# Patient Record
Sex: Male | Born: 1985 | Race: White | Hispanic: No | Smoking: Current every day smoker
Health system: Southern US, Community
[De-identification: ages and names within clinical notes are randomized; demographics above are authoritative.]

## PROBLEM LIST (undated history)

## (undated) DIAGNOSIS — F319 Bipolar disorder, unspecified: Secondary | ICD-10-CM

## (undated) DIAGNOSIS — F329 Major depressive disorder, single episode, unspecified: Secondary | ICD-10-CM

## (undated) DIAGNOSIS — F1910 Other psychoactive substance abuse, uncomplicated: Secondary | ICD-10-CM

## (undated) DIAGNOSIS — M549 Dorsalgia, unspecified: Secondary | ICD-10-CM

## (undated) DIAGNOSIS — F32A Depression, unspecified: Secondary | ICD-10-CM

## (undated) DIAGNOSIS — G8929 Other chronic pain: Secondary | ICD-10-CM

---

## 2009-11-20 ENCOUNTER — Emergency Department (HOSPITAL_COMMUNITY): Admission: EM | Admit: 2009-11-20 | Discharge: 2009-11-21 | Payer: Self-pay | Admitting: Emergency Medicine

## 2009-11-21 ENCOUNTER — Ambulatory Visit: Payer: Self-pay | Admitting: Psychiatry

## 2009-11-21 ENCOUNTER — Inpatient Hospital Stay (HOSPITAL_COMMUNITY): Admission: RE | Admit: 2009-11-21 | Discharge: 2009-11-25 | Payer: Self-pay | Admitting: Psychiatry

## 2010-07-11 LAB — BASIC METABOLIC PANEL
CO2: 26 mEq/L (ref 19–32)
Calcium: 9 mg/dL (ref 8.4–10.5)
GFR calc Af Amer: >60 mL/min (ref >60)
GFR calc non Af Amer: >60 mL/min (ref >60)
Sodium: 138 mEq/L (ref 135–145)

## 2010-07-11 LAB — DIFFERENTIAL
Lymphs Abs: 1.9 10*3/uL (ref 0.7–4.0)
Monocytes Relative: 4 % (ref 3–12)
Neutro Abs: 6.6 10*3/uL (ref 1.7–7.7)
Neutrophils Relative %: 71 % (ref 43–77)

## 2010-07-11 LAB — LIPID PANEL
HDL: 33 mg/dL — ABNORMAL LOW (ref >39)
LDL Cholesterol: 107 mg/dL — ABNORMAL HIGH (ref 0–99)
Total CHOL/HDL Ratio: 4.8 RATIO
VLDL: 20 mg/dL (ref 0–40)

## 2010-07-11 LAB — HEPATIC FUNCTION PANEL
AST: 15 U/L (ref 0–37)
Albumin: 3.7 g/dL (ref 3.5–5.2)
Alkaline Phosphatase: 69 U/L (ref 39–117)
Total Bilirubin: 0.5 mg/dL (ref 0.3–1.2)

## 2010-07-11 LAB — URINALYSIS, ROUTINE W REFLEX MICROSCOPIC
Bilirubin Urine: NEGATIVE
Nitrite: NEGATIVE
Specific Gravity, Urine: 1.025 (ref 1.005–1.030)
pH: 7 (ref 5.0–8.0)

## 2010-07-11 LAB — CBC
Hemoglobin: 15.3 g/dL (ref 13.0–17.0)
RBC: 5 MIL/uL (ref 4.22–5.81)
WBC: 9.4 10*3/uL (ref 4.0–10.5)

## 2010-07-11 LAB — TRICYCLICS SCREEN, URINE: TCA Scrn: NOT DETECTED

## 2010-07-11 LAB — RAPID URINE DRUG SCREEN, HOSP PERFORMED: Cocaine: NOT DETECTED

## 2010-10-20 ENCOUNTER — Emergency Department (HOSPITAL_COMMUNITY)
Admission: EM | Admit: 2010-10-20 | Discharge: 2010-10-21 | Disposition: A | Discharge status: Other Acute Inpatient Hospital | Payer: Self-pay | Attending: Emergency Medicine | Admitting: Emergency Medicine

## 2010-10-20 DIAGNOSIS — R45850 Homicidal ideations: Secondary | ICD-10-CM | POA: Insufficient documentation

## 2010-10-20 DIAGNOSIS — F1110 Opioid abuse, uncomplicated: Secondary | ICD-10-CM | POA: Insufficient documentation

## 2010-10-20 DIAGNOSIS — F329 Major depressive disorder, single episode, unspecified: Secondary | ICD-10-CM | POA: Insufficient documentation

## 2010-10-20 DIAGNOSIS — F3289 Other specified depressive episodes: Secondary | ICD-10-CM | POA: Insufficient documentation

## 2010-10-20 DIAGNOSIS — R45851 Suicidal ideations: Secondary | ICD-10-CM | POA: Insufficient documentation

## 2010-10-20 DIAGNOSIS — F1210 Cannabis abuse, uncomplicated: Secondary | ICD-10-CM | POA: Insufficient documentation

## 2010-10-20 DIAGNOSIS — F1410 Cocaine abuse, uncomplicated: Secondary | ICD-10-CM | POA: Insufficient documentation

## 2010-10-20 LAB — CBC
HCT: 44 % (ref 39.0–52.0)
Hemoglobin: 15.7 g/dL (ref 13.0–17.0)
MCH: 30.4 pg (ref 26.0–34.0)
MCV: 85.3 fL (ref 78.0–100.0)
RBC: 5.16 MIL/uL (ref 4.22–5.81)
RDW: 12.2 % (ref 11.5–15.5)
WBC: 9.9 10*3/uL (ref 4.0–10.5)

## 2010-10-20 LAB — URINALYSIS, ROUTINE W REFLEX MICROSCOPIC
Hgb urine dipstick: NEGATIVE
Ketones, ur: 15 mg/dL — AB
Protein, ur: 30 mg/dL — AB
Urobilinogen, UA: 1 mg/dL (ref 0.0–1.0)

## 2010-10-20 LAB — COMPREHENSIVE METABOLIC PANEL
Alkaline Phosphatase: 85 U/L (ref 39–117)
BUN: 12 mg/dL (ref 6–23)
CO2: 27 mEq/L (ref 19–32)
Chloride: 105 mEq/L (ref 96–112)
GFR calc Af Amer: >60 mL/min (ref >60)
Glucose, Bld: 89 mg/dL (ref 70–99)
Potassium: 3.2 mEq/L — ABNORMAL LOW (ref 3.5–5.1)
Total Bilirubin: 0.5 mg/dL (ref 0.3–1.2)

## 2010-10-20 LAB — URINE MICROSCOPIC-ADD ON

## 2010-10-20 LAB — RAPID URINE DRUG SCREEN, HOSP PERFORMED
Cocaine: POSITIVE — AB
Opiates: POSITIVE — AB

## 2010-10-20 LAB — DIFFERENTIAL
Eosinophils Absolute: 0.2 10*3/uL (ref 0.0–0.7)
Lymphocytes Relative: 26 % (ref 12–46)
Lymphs Abs: 2.5 10*3/uL (ref 0.7–4.0)
Monocytes Relative: 10 % (ref 3–12)
Neutrophils Relative %: 62 % (ref 43–77)

## 2010-10-20 LAB — SALICYLATE LEVEL: Salicylate Lvl: <2 mg/dL — ABNORMAL LOW (ref 2.8–20.0)

## 2010-10-22 ENCOUNTER — Inpatient Hospital Stay (HOSPITAL_COMMUNITY)
Admission: AD | Admit: 2010-10-22 | Discharge: 2010-10-26 | DRG: 897 | Disposition: A | Discharge status: Home / Self Care | Payer: PRIVATE HEALTH INSURANCE | Source: Ambulatory Visit | Attending: Psychiatry | Admitting: Psychiatry

## 2010-10-22 DIAGNOSIS — F1120 Opioid dependence, uncomplicated: Principal | ICD-10-CM

## 2010-10-22 DIAGNOSIS — F1910 Other psychoactive substance abuse, uncomplicated: Secondary | ICD-10-CM

## 2010-10-22 DIAGNOSIS — Z5900 Homelessness unspecified: Secondary | ICD-10-CM

## 2010-10-22 DIAGNOSIS — Z560 Unemployment, unspecified: Secondary | ICD-10-CM

## 2010-10-22 DIAGNOSIS — F3289 Other specified depressive episodes: Secondary | ICD-10-CM

## 2010-10-22 DIAGNOSIS — R45851 Suicidal ideations: Secondary | ICD-10-CM

## 2010-10-22 DIAGNOSIS — F39 Unspecified mood [affective] disorder: Secondary | ICD-10-CM

## 2010-10-22 DIAGNOSIS — F1994 Other psychoactive substance use, unspecified with psychoactive substance-induced mood disorder: Secondary | ICD-10-CM

## 2010-10-22 DIAGNOSIS — F329 Major depressive disorder, single episode, unspecified: Secondary | ICD-10-CM

## 2010-10-22 DIAGNOSIS — Z880 Allergy status to penicillin: Secondary | ICD-10-CM

## 2010-10-23 NOTE — H&P (Signed)
NAMERENATO, SPELLMAN NO.:  [id number]  MEDICAL RECORD NO.:  [id number]  LOCATION:  0304                          FACILITY:  [facility]  PHYSICIAN:  Debbora Lacrosse, MD       DATE OF BIRTH:  12-Jan-1986  DATE OF ADMISSION:  10/21/2010 DATE OF DISCHARGE:                      PSYCHIATRIC ADMISSION ASSESSMENT   This is a 25 year old male who was voluntarily admitted on October 21, 2010.  HISTORY OF PRESENT ILLNESS:  The patient presents with a history of depression and suicidal thinking and substance use.  He has been using marijuana, cocaine, pain pills, primarily Percocet.  His last use was 2 days ago.  He had a suicidal plan to overdose.  He denies any psychotic symptoms.  His sleep has been satisfactory.  He has lost some weight.  PAST PSYCHIATRIC HISTORY:  The patient states he was here about a year ago for "same thing, depression and substance use."  Has been on Celexa and Paxil in the past which he thought was effective.  SOCIAL HISTORY:  A 25 year old married but states he has been separated for 2 years, has a child.  The patient is unemployed.  He lives alone. Considers himself homeless at this time.  He does have some family in [place].  FAMILY HISTORY:  None.  ALCOHOL/DRUG HISTORY:  As above.  Denies any IV drug use.  PRIMARY CARE PROVIDER:  None.  MEDICAL PROBLEMS:  The patient considers himself healthy.  MEDICATIONS:  None.  DRUG ALLERGIES:  PENICILLIN.  PHYSICAL EXAM:  Done in the emergency room.  This is a normally- developed male.  He appears in no distress.  He is again reporting some abdominal cramping.  Physical exam the emergency room was reviewed with no significant findings.  LABORATORY DATA:  Shows a salicylate less than 2.  CBC within normal limits.  Urine drug screen positive for opiates, positive for cocaine, positive for cannabis.  Urinalysis is negative.  Acetaminophen level less than 15.  MENTAL STATUS EXAM:  Patient is alert and  he is dressed in his own clothing, casually dressed.  No eye contact.  He is looking at the door during the interview.  Speech is clear, normal pace and tone.  The patient's mood is depressed.  Possibly paranoid, reserved, offering little.  Promises safety.  He denies any suicidal or homicidal thoughts.  Cognitive function intact. Memory is intact.  Judgment and insight are fair. Poor historian AXIS I:  Opiate dependence, polysubstance abuse, rule out substance- induced mood disorder. AXIS II:  Deferred. AXIS III:  No known medical conditions. AXIS IV:  Other psychosocial problems, possible problems with housing and poor social support. AXIS V:  Current is 45.  Our plan is to start the patient on the clonidine protocol for withdrawal symptoms.  We will continue to monitor comorbidities.  The patient is interested in going to the [facility], case manager will address that possibility.  His tentative length of stay at this time is 3 to 5 days.     Landry Corporal, N.P.   ______________________________ Debbora Lacrosse, MD    JO/MEDQ  D:  10/22/2010  T:  10/22/2010  Job:  [id number]  Electronically Signed by Landry Corporal  N.P. on 10/22/2010 03:44:05 PM Electronically Signed by Andi Devon Pradeep Beaubrun  on 10/23/2010 08:18:37 AM

## 2010-10-25 DIAGNOSIS — F1910 Other psychoactive substance abuse, uncomplicated: Secondary | ICD-10-CM

## 2010-10-25 DIAGNOSIS — F39 Unspecified mood [affective] disorder: Secondary | ICD-10-CM

## 2010-10-30 NOTE — Discharge Summary (Signed)
Reginald Bowman, Reginald Bowman               ACCOUNT NO.:  [id number]  MEDICAL RECORD NO.:  [id number]  LOCATION:  0302                          FACILITY:  [facility]  PHYSICIAN:  Eulogio Ditch, MD DATE OF BIRTH:  1986-02-03  DATE OF ADMISSION:  10/22/2010 DATE OF DISCHARGE:  10/26/2010                              DISCHARGE SUMMARY   IDENTIFYING INFORMATION:  This is a 25 year old Caucasian male.  This is a voluntary admission.  HISTORY OF PRESENT ILLNESS:  This a second [facility] admission for Reginald Bowman who presents with a history of depression and suicidal thinking concurrent with substance abuse.  He had been using marijuana, cocaine and pain pills, primarily Percocet, with last use 2 days prior to admission.  He had a suicidal plan to overdose on any drug he could get off the street. He had a history of a previous admission in August 2011 also for depression and polysubstance abuse and a history of previous trials on Celexa and Paxil which he thought were effective.  He is a 25 year old separated young man who was homeless.  MEDICAL EVALUATION AND DIAGNOSTIC STUDIES:  He was medically evaluated in the emergency room where his full physical exam is documented.  He is a normally developed Caucasian male, medium height and weight and denying a history of IV drug abuse.  He did present with intestinal cramping and some skeletal muscle cramping which he attributed to opiate withdrawal.  His urine drug screen was noted to be positive for opiates, cocaine and cannabis metabolites.  Acetaminophen level was less than 15. CBC normal chemistries unremarkable and unremarkable urinalysis.  COURSE OF HOSPITALIZATION:  He was admitted to our dual diagnosis unit and given a provisional diagnosis of substance induced mood disorder, opiate dependence and polysubstance abuse.  He was started on a clonidine protocol to manage his opiate withdrawal symptoms and gradually assimilated into the milieu.  By July 1 he  was denying any further suicidal thoughts.  His depression was decreased to a score of 3 with 10 being the most hopeless.  His attention in group therapy was spotty.  He was generally cooperative and appropriate with peers and staff.  He was started on sertraline 50 mg p.o. q.a.m. and was started on Geodon 40 mg q.h.s. to address his complaints of agitation.  He was initially evaluated by Dr. Artelia Laroche at the time of admission and was transferred to the care of Eulogio Ditch, MD on July 2. His mood stabilized.  He complained of nausea and did not want to take the Zoloft and Geodon which were subsequently discontinued.  His mood continued to be stable and by July 2 he was requesting to be discharged, felt better, withdrawal symptoms had subsided.  He was in full contact with reality and denying any dangerous thoughts.  He declined our case manager's efforts to place him in a drug rehab program and requested a bus ticket with plans to hitchhike to the [place] area where he had some relatives.  DISCHARGE/PLAN:  Follow up with [facility] in [place], [place] on October 29, 2010 at 10:30 a.m.  DISCHARGE MEDICATIONS:  None.  DISCHARGE DIAGNOSIS:  Substance-induced mood disorder, 0piate abuse and dependence, polysubstance  abuse.     Young Berry. Lorin Picket, N.P.   ______________________________ Eulogio Ditch, MD    MAS/MEDQ  D:  10/29/2010  T:  10/29/2010  Job:  [id number]  Electronically Signed by Kari Baars N.P. on 10/29/2010 04:35:43 PM Electronically Signed by Eulogio Ditch  on 10/30/2010 07:32:22 AM

## 2013-06-12 DIAGNOSIS — F1020 Alcohol dependence, uncomplicated: Secondary | ICD-10-CM | POA: Insufficient documentation

## 2014-07-24 ENCOUNTER — Emergency Department
Admit: 2014-07-24 | Disposition: A | Discharge status: Other Acute Inpatient Hospital | Payer: Self-pay | Admitting: Emergency Medicine

## 2014-07-24 LAB — URINALYSIS, COMPLETE
BACTERIA: NONE SEEN
Bilirubin,UR: NEGATIVE
Blood: NEGATIVE
GLUCOSE, UR: NEGATIVE mg/dL (ref 0–75)
LEUKOCYTE ESTERASE: NEGATIVE
NITRITE: NEGATIVE
Ph: 5 (ref 4.5–8.0)
Protein: 30
RBC,UR: 2 /HPF (ref 0–5)
SPECIFIC GRAVITY: 1.032 (ref 1.003–1.030)
SQUAMOUS EPITHELIAL: NONE SEEN

## 2014-07-24 LAB — ACETAMINOPHEN LEVEL: Acetaminophen: <10 ug/mL

## 2014-07-24 LAB — DRUG SCREEN, URINE
Amphetamines, Ur Screen: NEGATIVE
Barbiturates, Ur Screen: NEGATIVE
Benzodiazepine, Ur Scrn: NEGATIVE
Cannabinoid 50 Ng, Ur ~~LOC~~: POSITIVE
Cocaine Metabolite,Ur ~~LOC~~: NEGATIVE
MDMA (Ecstasy)Ur Screen: NEGATIVE
METHADONE, UR SCREEN: NEGATIVE
Opiate, Ur Screen: NEGATIVE
Phencyclidine (PCP) Ur S: NEGATIVE
TRICYCLIC, UR SCREEN: NEGATIVE

## 2014-07-24 LAB — COMPREHENSIVE METABOLIC PANEL
ALBUMIN: 4.6 g/dL
ALT: 14 U/L — AB
AST: 19 U/L
Alkaline Phosphatase: 65 U/L
Anion Gap: 7 (ref 7–16)
BUN: 19 mg/dL
Bilirubin,Total: 0.3 mg/dL
Calcium, Total: 9.4 mg/dL
Chloride: 106 mmol/L
Co2: 25 mmol/L
Creatinine: 0.96 mg/dL
EGFR (Non-African Amer.): >60
Glucose: 115 mg/dL — ABNORMAL HIGH
POTASSIUM: 3.5 mmol/L
Sodium: 138 mmol/L
TOTAL PROTEIN: 7.7 g/dL

## 2014-07-24 LAB — CBC
HCT: 47.8 % (ref 40.0–52.0)
HGB: 15.8 g/dL (ref 13.0–18.0)
MCH: 29.3 pg (ref 26.0–34.0)
MCHC: 33 g/dL (ref 32.0–36.0)
MCV: 89 fL (ref 80–100)
Platelet: 171 10*3/uL (ref 150–440)
RBC: 5.38 10*6/uL (ref 4.40–5.90)
RDW: 13.7 % (ref 11.5–14.5)
WBC: 15.4 10*3/uL — ABNORMAL HIGH (ref 3.8–10.6)

## 2014-07-24 LAB — SALICYLATE LEVEL: Salicylates, Serum: <4 mg/dL

## 2014-07-24 LAB — ETHANOL

## 2014-08-06 ENCOUNTER — Emergency Department
Admit: 2014-08-06 | Disposition: A | Discharge status: Home / Self Care | Payer: Self-pay | Admitting: Emergency Medicine

## 2014-08-06 LAB — COMPREHENSIVE METABOLIC PANEL
ANION GAP: 7 (ref 7–16)
Albumin: 4.9 g/dL
Alkaline Phosphatase: 62 U/L
BILIRUBIN TOTAL: 0.7 mg/dL
BUN: 19 mg/dL
CO2: 28 mmol/L
Calcium, Total: 9.4 mg/dL
Chloride: 106 mmol/L
Creatinine: 1.18 mg/dL
EGFR (African American): >60
GLUCOSE: 107 mg/dL — AB
POTASSIUM: 3.8 mmol/L
SGOT(AST): 23 U/L
SGPT (ALT): 15 U/L — ABNORMAL LOW
SODIUM: 141 mmol/L
Total Protein: 7.8 g/dL

## 2014-08-06 LAB — URINALYSIS, COMPLETE
Bilirubin,UR: NEGATIVE
Blood: NEGATIVE
Glucose,UR: NEGATIVE mg/dL (ref 0–75)
LEUKOCYTE ESTERASE: NEGATIVE
Nitrite: NEGATIVE
PH: 5 (ref 4.5–8.0)
Specific Gravity: 1.027 (ref 1.003–1.030)

## 2014-08-06 LAB — CBC
HCT: 46.1 % (ref 40.0–52.0)
HGB: 15.3 g/dL (ref 13.0–18.0)
MCH: 29.5 pg (ref 26.0–34.0)
MCHC: 33.2 g/dL (ref 32.0–36.0)
MCV: 89 fL (ref 80–100)
Platelet: 168 10*3/uL (ref 150–440)
RBC: 5.18 10*6/uL (ref 4.40–5.90)
RDW: 13.5 % (ref 11.5–14.5)
WBC: 16 10*3/uL — ABNORMAL HIGH (ref 3.8–10.6)

## 2014-08-06 LAB — DRUG SCREEN, URINE
Amphetamines, Ur Screen: NEGATIVE
Barbiturates, Ur Screen: NEGATIVE
Benzodiazepine, Ur Scrn: NEGATIVE
CANNABINOID 50 NG, UR ~~LOC~~: POSITIVE
COCAINE METABOLITE, UR ~~LOC~~: NEGATIVE
MDMA (ECSTASY) UR SCREEN: NEGATIVE
Methadone, Ur Screen: NEGATIVE
Opiate, Ur Screen: POSITIVE
PHENCYCLIDINE (PCP) UR S: POSITIVE
Tricyclic, Ur Screen: NEGATIVE

## 2014-08-06 LAB — ETHANOL: Ethanol: <5 mg/dL

## 2014-08-06 LAB — ACETAMINOPHEN LEVEL: Acetaminophen: <10 ug/mL

## 2014-08-06 LAB — SALICYLATE LEVEL: Salicylates, Serum: <4 mg/dL

## 2014-08-12 ENCOUNTER — Observation Stay (HOSPITAL_COMMUNITY)
Admission: EM | Admit: 2014-08-12 | Discharge: 2014-08-14 | Disposition: A | Discharge status: Home / Self Care | Payer: Medicaid Other | Attending: General Surgery | Admitting: General Surgery

## 2014-08-12 ENCOUNTER — Emergency Department (HOSPITAL_COMMUNITY): Payer: Medicaid Other

## 2014-08-12 ENCOUNTER — Encounter (HOSPITAL_COMMUNITY): Payer: Self-pay | Admitting: Emergency Medicine

## 2014-08-12 DIAGNOSIS — S22069A Unspecified fracture of T7-T8 vertebra, initial encounter for closed fracture: Principal | ICD-10-CM | POA: Diagnosis present

## 2014-08-12 DIAGNOSIS — Z590 Homelessness: Secondary | ICD-10-CM | POA: Insufficient documentation

## 2014-08-12 DIAGNOSIS — S22009A Unspecified fracture of unspecified thoracic vertebra, initial encounter for closed fracture: Secondary | ICD-10-CM | POA: Diagnosis present

## 2014-08-12 DIAGNOSIS — Y9301 Activity, walking, marching and hiking: Secondary | ICD-10-CM | POA: Diagnosis not present

## 2014-08-12 DIAGNOSIS — M542 Cervicalgia: Secondary | ICD-10-CM

## 2014-08-12 DIAGNOSIS — F319 Bipolar disorder, unspecified: Secondary | ICD-10-CM | POA: Insufficient documentation

## 2014-08-12 DIAGNOSIS — Z720 Tobacco use: Secondary | ICD-10-CM | POA: Diagnosis not present

## 2014-08-12 DIAGNOSIS — Z880 Allergy status to penicillin: Secondary | ICD-10-CM | POA: Diagnosis not present

## 2014-08-12 DIAGNOSIS — F1910 Other psychoactive substance abuse, uncomplicated: Secondary | ICD-10-CM | POA: Insufficient documentation

## 2014-08-12 DIAGNOSIS — S22059A Unspecified fracture of T5-T6 vertebra, initial encounter for closed fracture: Secondary | ICD-10-CM | POA: Diagnosis present

## 2014-08-12 HISTORY — DX: Bipolar disorder, unspecified: F31.9

## 2014-08-12 HISTORY — DX: Other psychoactive substance abuse, uncomplicated: F19.10

## 2014-08-12 HISTORY — DX: Major depressive disorder, single episode, unspecified: F32.9

## 2014-08-12 HISTORY — DX: Depression, unspecified: F32.A

## 2014-08-12 LAB — CBC WITH DIFFERENTIAL/PLATELET
BASOS ABS: 0 10*3/uL (ref 0.0–0.1)
Basophils Relative: 0 % (ref 0–1)
EOS ABS: 0.3 10*3/uL (ref 0.0–0.7)
EOS PCT: 2 % (ref 0–5)
HEMATOCRIT: 45 % (ref 39.0–52.0)
Hemoglobin: 15.3 g/dL (ref 13.0–17.0)
Lymphocytes Relative: 19 % (ref 12–46)
Lymphs Abs: 2.2 10*3/uL (ref 0.7–4.0)
MCH: 30.2 pg (ref 26.0–34.0)
MCHC: 34 g/dL (ref 30.0–36.0)
MCV: 88.8 fL (ref 78.0–100.0)
Monocytes Absolute: 0.8 10*3/uL (ref 0.1–1.0)
Monocytes Relative: 7 % (ref 3–12)
Neutro Abs: 8.1 10*3/uL — ABNORMAL HIGH (ref 1.7–7.7)
Neutrophils Relative %: 72 % (ref 43–77)
PLATELETS: 166 10*3/uL (ref 150–400)
RBC: 5.07 MIL/uL (ref 4.22–5.81)
RDW: 13.4 % (ref 11.5–15.5)
WBC: 11.3 10*3/uL — AB (ref 4.0–10.5)

## 2014-08-12 LAB — COMPREHENSIVE METABOLIC PANEL
ALBUMIN: 4.3 g/dL (ref 3.5–5.2)
ALT: 24 U/L (ref 0–53)
AST: 41 U/L — ABNORMAL HIGH (ref 0–37)
Alkaline Phosphatase: 57 U/L (ref 39–117)
Anion gap: 10 (ref 5–15)
BUN: 22 mg/dL (ref 6–23)
CALCIUM: 9.2 mg/dL (ref 8.4–10.5)
CO2: 25 mmol/L (ref 19–32)
Chloride: 104 mmol/L (ref 96–112)
Creatinine, Ser: 1.3 mg/dL (ref 0.50–1.35)
GFR calc Af Amer: 84 mL/min — ABNORMAL LOW (ref >90)
GFR, EST NON AFRICAN AMERICAN: 72 mL/min — AB (ref >90)
GLUCOSE: 108 mg/dL — AB (ref 70–99)
POTASSIUM: 4.7 mmol/L (ref 3.5–5.1)
Sodium: 139 mmol/L (ref 135–145)
TOTAL PROTEIN: 6.9 g/dL (ref 6.0–8.3)
Total Bilirubin: 0.8 mg/dL (ref 0.3–1.2)

## 2014-08-12 LAB — RAPID URINE DRUG SCREEN, HOSP PERFORMED
Amphetamines: NOT DETECTED
BENZODIAZEPINES: NOT DETECTED
Barbiturates: NOT DETECTED
COCAINE: POSITIVE — AB
OPIATES: NOT DETECTED
Tetrahydrocannabinol: POSITIVE — AB

## 2014-08-12 LAB — I-STAT CG4 LACTIC ACID, ED: Lactic Acid, Venous: 0.89 mmol/L (ref 0.5–2.0)

## 2014-08-12 MED ORDER — HYDROMORPHONE HCL 1 MG/ML IJ SOLN
INTRAMUSCULAR | Status: AC
  Filled 2014-08-12: qty 1

## 2014-08-12 MED ORDER — HYDROMORPHONE HCL 1 MG/ML IJ SOLN
1.0000 mg | Freq: Once | INTRAMUSCULAR | Status: AC
  Administered 2014-08-12: 1 mg via INTRAVENOUS

## 2014-08-12 MED ORDER — HYDROMORPHONE HCL 1 MG/ML IJ SOLN
1.0000 mg | Freq: Once | INTRAMUSCULAR | Status: DC
Start: 2014-08-13 — End: 2014-08-13

## 2014-08-12 MED ORDER — IOHEXOL 300 MG/ML  SOLN
100.0000 mL | Freq: Once | INTRAMUSCULAR | Status: AC | PRN
  Administered 2014-08-12: 100 mL via INTRAVENOUS

## 2014-08-12 MED ORDER — BACITRACIN ZINC 500 UNIT/GM EX OINT
TOPICAL_OINTMENT | Freq: Two times a day (BID) | CUTANEOUS | Status: DC
  Administered 2014-08-13 (×2): 15.5556 via TOPICAL
  Administered 2014-08-14: 1 via TOPICAL
  Filled 2014-08-12: qty 28.35

## 2014-08-12 MED ORDER — SODIUM CHLORIDE 0.9 % IV BOLUS (SEPSIS)
1000.0000 mL | Freq: Once | INTRAVENOUS | Status: AC
  Administered 2014-08-12: 1000 mL via INTRAVENOUS

## 2014-08-12 NOTE — ED Notes (Signed)
Wright, MD at bedside

## 2014-08-12 NOTE — ED Notes (Signed)
Pt transported to radiology.

## 2014-08-12 NOTE — ED Notes (Signed)
Per registration, pt is threatening to take off his c-collar and leave. Delford FieldWright, MD notified, and is at bedside.
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
## 2014-08-12 NOTE — ED Notes (Signed)
Pt is eager to leave. A nurse tech found the pt sitting up in bed stating that he wants to leave. This RN went into the room, and pt states "I know you have protocol, but I know my body, and there's nothing wrong with me". This RN asked the pt to lay back down, and informed him that he needs to wait for his CT scans to come back before he can go.

## 2014-08-12 NOTE — ED Notes (Signed)
This RN heard the pt yelling, and went to bedside. Pt found sitting up on the end of the bed, with his c-collar on the ground and he had taken off his monitoring equipment. Pt states "I wanna fucking get Arlan Organoutta here! And you better fucking get me back to [place]. I'm leaving". When this RN told him that he needed to sign the AMA form, he yelled "I'm not fucking signing anything except for something getting me back to [place]". Delford FieldWright, MD notified.

## 2014-08-13 ENCOUNTER — Observation Stay (HOSPITAL_COMMUNITY): Payer: Medicaid Other

## 2014-08-13 ENCOUNTER — Encounter (HOSPITAL_COMMUNITY): Payer: Self-pay | Admitting: Orthopedic Surgery

## 2014-08-13 DIAGNOSIS — S22059A Unspecified fracture of T5-T6 vertebra, initial encounter for closed fracture: Secondary | ICD-10-CM | POA: Diagnosis present

## 2014-08-13 DIAGNOSIS — S22069A Unspecified fracture of T7-T8 vertebra, initial encounter for closed fracture: Secondary | ICD-10-CM | POA: Diagnosis present

## 2014-08-13 MED ORDER — SODIUM CHLORIDE 0.9 % IV SOLN
INTRAVENOUS | Status: DC
  Administered 2014-08-13: 02:00:00 via INTRAVENOUS

## 2014-08-13 MED ORDER — HYDROMORPHONE HCL 1 MG/ML IJ SOLN: 0.5000 mg | INTRAMUSCULAR | Status: DC | PRN

## 2014-08-13 MED ORDER — OXYCODONE HCL 5 MG PO TABS: 10.0000 mg | ORAL_TABLET | ORAL | Status: DC | PRN

## 2014-08-13 MED ORDER — ENOXAPARIN SODIUM 40 MG/0.4ML ~~LOC~~ SOLN
40.0000 mg | SUBCUTANEOUS | Status: DC
Start: 2014-08-13 — End: 2014-08-14
  Administered 2014-08-13: 40 mg via SUBCUTANEOUS
  Filled 2014-08-13: qty 0.4

## 2014-08-13 MED ORDER — OXYCODONE HCL 5 MG PO TABS: 5.0000 mg | ORAL_TABLET | ORAL | Status: DC | PRN

## 2014-08-13 MED ORDER — PANTOPRAZOLE SODIUM 40 MG IV SOLR: 40.0000 mg | Freq: Every day | INTRAVENOUS | Status: DC

## 2014-08-13 MED ORDER — ONDANSETRON HCL 4 MG PO TABS: 4.0000 mg | ORAL_TABLET | Freq: Four times a day (QID) | ORAL | Status: DC | PRN

## 2014-08-13 MED ORDER — HYDROMORPHONE HCL 1 MG/ML IJ SOLN
1.0000 mg | INTRAMUSCULAR | Status: DC | PRN
  Administered 2014-08-13: 1 mg via INTRAVENOUS
  Filled 2014-08-13: qty 1

## 2014-08-13 MED ORDER — PANTOPRAZOLE SODIUM 40 MG PO TBEC
40.0000 mg | DELAYED_RELEASE_TABLET | Freq: Every day | ORAL | Status: DC
Start: 2014-08-13 — End: 2014-08-13

## 2014-08-13 MED ORDER — ONDANSETRON HCL 4 MG/2ML IJ SOLN: 4.0000 mg | Freq: Four times a day (QID) | INTRAMUSCULAR | Status: DC | PRN

## 2014-08-13 MED ORDER — HYDROMORPHONE HCL 1 MG/ML IJ SOLN
INTRAMUSCULAR | Status: AC
  Administered 2014-08-13: 1 mg
  Filled 2014-08-13: qty 1
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
## 2014-08-13 NOTE — Progress Notes (Signed)
0130 Ortho Tech call about TLSO brace and stated it was ordered from outside of [facility] and would be here in the am.

## 2014-08-13 NOTE — Progress Notes (Signed)
OT Cancellation Note  Patient Details Name: Reginald Bowman MRN: [id number] DOB: 02/11/1986   Cancelled Treatment:    Reason Eval/Treat Not Completed: Other (comment) Waiting on TLSO to mobilize pt. Please page [phone] once pt gets TLSO in order to evaluate. Thanks [facility]  Antonie Borjon, OTR/L  [phone] 08/13/2014 08/13/2014, 12:03 PM

## 2014-08-13 NOTE — Progress Notes (Signed)
SBIRT completed.  Pt denies alcohol consumption.

## 2014-08-13 NOTE — Progress Notes (Signed)
Orthopedic Tech Progress Note Patient Details:  Reginald Bowman 09-16-1985 [id number]  Patient ID: Reginald Bowman, male   DOB: 09-16-1985, 29 y.o.   MRN: [id number] Called in bio-tech brace order; spoke with Richardean Chimeraathy  Reginald Bowman 08/13/2014, 2:10 PM

## 2014-08-13 NOTE — ED Notes (Signed)
Ortho paged in regards to TLSO brace- st they have to call to have it sent to the hospital.
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
## 2014-08-13 NOTE — Progress Notes (Signed)
Noted cervical collar off, patient took the collar off saying he is already fed up with it. PA made aware.

## 2014-08-13 NOTE — Progress Notes (Signed)
UR completed 
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
## 2014-08-13 NOTE — ED Notes (Signed)
RN in to check on pt. Pt laying half way on the bed- not connected to monitor , has taken out IV and has taken off c-collar. Pt sts he had to stand up. Pt educated that he needed to stay laying in the bed with collar in place to insure he did not injure himself further. Pt assisted back into bed and placed back on monitor

## 2014-08-13 NOTE — Progress Notes (Signed)
PT Cancellation Note  Patient Details Name: Reginald Bowman MRN: [id number] DOB: 17-Dec-1985   Cancelled Treatment:    Reason Eval/Treat Not Completed: Patient not medically ready; awaiting TLSO and clearance with cervical flex/ext x-rays prior to mobilizing patient.  Will attempt to see patient next date.    Mazin Emma,CYNDI 08/13/2014, 12:04 PM  Sheran Lawlessyndi Platon Arocho, PT [phone] 08/13/2014

## 2014-08-14 NOTE — Progress Notes (Signed)
UR completed 
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
## 2014-08-14 NOTE — Discharge Instructions (Signed)
Have brace on whenever you are sitting or standing.
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
## 2014-08-14 NOTE — Progress Notes (Signed)
Discharge order placed on patient. Patient stated ", I am ready to go, I can go hold a sign and get back to [facility] faster that it's taking your social worker to get here." RN convinced patient to stay a little longer, until we heard back from the social worker. Patient called nurse about 15 mins later and said ", I am tired of waiting, give me my discharge papers." RN reviewed discharge with patient. RN walk patient to front door at which time patient walked toward urgent care. Upon nurse returning to unit, social worker was on unit to bring patient a ticket to get to [facility]. RN and Child psychotherapistsocial worker attempted to go look for patient. Patient spotted, however disappeared before nurse could get to him.

## 2014-08-17 ENCOUNTER — Emergency Department
Admit: 2014-08-17 | Disposition: A | Discharge status: Home / Self Care | Payer: Self-pay | Admitting: Emergency Medicine

## 2014-08-25 NOTE — Consult Note (Signed)
PAtient seen. Continues to state he feels terrible and hopeless.  SI and feeling very tired. Little activity. Affect flat and speech minimal. Passive SI and no psychosis. Poor motivation has been referred out but no bed availible. Reevaluate tomorrow for disposition. 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

## 2014-08-25 NOTE — Consult Note (Signed)
Brief Consult Note: Diagnosis: Depression not otherwise specified, borderline personality disorder, polysubstance abuse.  Delirium due to ingestion of drugs.   Patient was seen by consultant.   Consult note dictated.   Recommend further assessment or treatment.   Discussed with Attending MD.   Comments: Psychiatry: Patient with a history of borderline personality disorder, multiple suicide attempts, chronic drug abuse of multiple substances.  States to me that he took an overdose of cough medicine along with marijuana.  Told the emergency room doctor that it was a suicide attempt.  Told me he was just trying to get high.  Patient is currently delirious and probably doesn't even remember.  No beds available on psychiatry tonight.  Reevaluate in the morning for further treatment.  Electronic Signatures: Clapacs, Jackquline DenmarkJohn T (MD)  (Signed 12-Apr-16 20:26)  Authored: Brief Consult Note   Last Updated: 12-Apr-16 20:26 by Audery Amellapacs, John T (MD)

## 2014-08-25 NOTE — Consult Note (Addendum)
PATIENT NAME:  Reginald Bowman, Reginald Bowman MR#:  [id number] DATE OF BIRTH:  [birth year]  DATE OF CONSULTATION:  08/06/2014  REFERRING PHYSICIAN:   CONSULTING PHYSICIAN:  Audery AmelJohn T. Clapacs, MD  IDENTIFYING INFORMATION AND REASON FOR CONSULTATION: A 29 year old man with a long history of substance abuse and personality disorder comes to the Emergency Room with chief complaint "I overdosed."   HISTORY OF PRESENT ILLNESS: Information from the patient and the chart. The patient came into the Emergency Room and told to the initial nurse and Emergency Room doctor that he had taken an overdose of cough medicine because he wanted to die. He claimed that this was because his girlfriend was cheating on him. When I saw the patient he told me that he had taken a full box of cough medicine pills and smoked a lot of marijuana. Claims he had not used any other drugs today. He told me that he did it only to get high and that he was not in any way trying to kill himself. Mood is chronically depressed and anxious. Sleep pattern chronically erratic. He has severe chronic stress from homelessness and lack of social support. [facility] and has continued to be homeless, staying at various people's houses since then. Has not gotten back together with his girlfriend. He is currently somewhat sedated and does not give much more history, will not admit to any other drug use. Says that he was not put on any medicine when he was discharged from the hospital.   PAST PSYCHIATRIC HISTORY: The patient has evidently had a very large number of psychiatric hospitalizations going back years. Chronic behaviors of self-mutilation, self injury and substance abuse. Does not follow up with outpatient treatment very consistently. Has reported that the medications have not been helpful for him in the past.   SOCIAL HISTORY: The patient gets disability. Had recently been involved with a girlfriend, but she cheated on  him and they are now broken up. Seems to have very little to no other social support.   PAST MEDICAL HISTORY: Other than his chronic self lacerations of which there are no acute wounds, no significant ongoing medical problems.   SUBSTANCE ABUSE HISTORY: Heavy daily marijuana use, also history of abuse of benzodiazepines and dextromethorphan.   FAMILY HISTORY: Family history of suicide in a grandfather and aunt; also, other people with depression on the mother's side.   CURRENT MEDICATIONS: None.   ALLERGIES: PENICILLIN.   REVIEW OF SYSTEMS: The patient is diaphoretic. Denies however having fevers or chills. Denies any acute pain. Feels a little woozy, but is not sick to his stomach. Denies suicidal ideation. Denies hallucinations.   MENTAL STATUS EXAMINATION: Disheveled, poorly groomed man, looks acutely sick. Awake and makes some attempt to be cooperative with the interview. Little to no eye contact. Psychomotor activity is fidgeting as though he is uncomfortable. Speech is very decreased in amount and quiet. Affect looks pained and confused. Mood is stated as being bad. Thoughts are minimal in amount. A little bit confused. No obvious delusions, however. He denies auditory or visual hallucinations. The patient is oriented to being in the hospital, although at first he told me the name of the wrong hospital. Did know the date. He was able to repeat 3 objects immediately, remembered 1 of them at 3 minutes. He is denying any suicidal or homicidal ideation to me. Judgment and insight are chronically impaired.   VITAL SIGNS: Blood pressure 144/85, respirations 20,  pulse 113, temperature 99.1.   LABORATORY RESULTS: Chemistry panel shows an ALT low at 15, other liver tests normal. Rest of the chemistry panel unremarkable. His drug screen is positive for opiates, phencyclidine, and cannabis. CBC: Elevated white count at 16. Urinalysis unremarkable.   ASSESSMENT: A 29 year old man with a history of  substance abuse, personality disorder, chronic mood instability, presents after taking an overdose. He has told conflicting stories as to whether he was actually intentionally trying to harm himself. Given his history and his current mild delirium, I imagine he probably does not even remember what his intention was. The patient requires further stabilization in the Emergency Room and re-evaluation tomorrow.   TREATMENT PLAN: No indication to begin any psychiatric medicine. Supportive and educational counseling and review of treatment plan with the patient. Case discussed with Emergency Room doctor. I will re-evaluate him tomorrow to assess whether he would benefit from psychiatric hospitalization or further treatment.   DIAGNOSIS, PRINCIPAL AND PRIMARY:  AXIS I: Delirium due to overdose with sedative.   SECONDARY DIAGNOSES:  AXIS I:  1.  Sedative abuse. 2.  Marijuana abuse.  3.  Depression, not otherwise specified. 4.  Personality disorder, borderline and histrionic features.    ____________________________ Audery Amel, MD jtc:TM D: 08/06/2014 21:38:06 ET T: 08/06/2014 22:03:23 ET JOB#: [id number]  cc: Audery Amel, MD, <Dictator> Audery Amel MD ELECTRONICALLY SIGNED 08/30/2014 10:11

## 2014-08-25 NOTE — Consult Note (Signed)
PATIENT NAME:  Reginald Bowman, Reginald Bowman MR#:  [id number] DATE OF BIRTH:  09/07/1985  DATE OF CONSULTATION:  07/24/2014  IDENTIFYING INFORMATION AND REASON FOR CONSULTATION: A 29 year old man with a history of mood instability came in to the Emergency Room with suicidal ideation and self-mutilation.   CHIEF COMPLAINT: " I cut myself up,"   HISTORY OF PRESENT ILLNESS: Information from the patient and the chart. The patient reports that this past weekend, he discovered that his girlfriend was cheating on him with, by his account, her own brother or stepbrother. He got overwhelmed and upset. Cut himself up pretty badly all over his forearms. Since then, he says he has been just out in the street smoking lots of weed, feeling overwhelmed with anger and depression. He has been banging his head and thinking about killing himself. Even prior to that, however, he says his mood has chronically been depressed and has been worse than usual recently. His sleep patterns are poor. Appetite is poor. He feels hopeless and negative all the time. Has frequent suicidal thoughts. Denies auditory or visual hallucinations. He is not getting any kind of outpatient psychiatric treatment whatsoever. He says that he only drug he has ever found to be helpful was marijuana. Still uses much of it is he can get. He had been thinking, however, that things were going to get better for him for a while because he had gotten his disability for his mood disorder and had been looking forward to he and his girlfriend forming a life together, but now he feels hopeless about it. Denies that he is drinking. Denies drugs currently otherwise, but says that recently, he had been using a lot of Xanax that he had just found somewhere and also using cough syrup when he can get it.   PAST PSYCHIATRIC HISTORY: He said he has been depressed ever since he was a kid and has had many hospitalizations. Been hospitalized at almost every psychiatric facility in the area. He  says that he has mutilated himself many times, done other things to hurt himself, but by his account, does not think he has ever seriously tried to kill himself because he is not dead yet. Denies psychotic symptoms. He has been prescribed antidepressants and mood stabilizers and does not think any of them have been helpful.   SOCIAL HISTORY: The patient and his girlfriend, now ex-girlfriend, had been living together, He is somewhat estranged from his family. Not able to work. Just got disability.   PAST MEDICAL HISTORY: Has a lot lacerations self-inflicted all over his arms, but denies any significant ongoing medical problems.   SUBSTANCE ABUSE HISTORY: Says that he uses much marijuana as he can get his hands on. Also, benzodiazepines when he can and cough syrup. Usually says he cannot afford any other drugs.   FAMILY HISTORY: Says he had a grandfather who committed suicide and an aunt who committed suicide and a mother with depression as well.   CURRENT MEDICATIONS: None.   ALLERGIES: PENICILLIN.   REVIEW OF SYSTEMS: Craving cigarettes right now. Mood is depressed and anxious. Denies hallucinations. Does not have suicidal ideation, but says that he certainly intends to go get high mutilating himself some more. No other acute physical complaints.   MENTAL STATUS EXAMINATION: Disheveled, poorly groomed gentleman, who looks his stated age or older. He was forthcoming to some extent in the interview, but passive. Eye contact was nonexistent. He tried to keep a blanket pulled over his face as much as he could during  the interview. Speech was garbled and muffled, although eventually, I got him to speak coherently. Affect dysphoric and anxious. Mood stated as being not so good. Thoughts are lucid. No evidence of delusions. Denies auditory or visual hallucinations. Endorses suicidal thoughts. No homicidal ideation. Alert and oriented x4. Can repeat 3 words immediately, remembers all 3 at 3 minutes. Judgment  and insight somewhat chronically impaired. Intelligence appears to probably be average. Normal fund of knowledge.   LABORATORY RESULTS: The chemistry panel is unremarkable. Slightly elevated glucose 115. Drug screen positive just for cannabis. Alcohol level negative. CBC: Elevated white count 15.4, otherwise unremarkable. Urinalysis positive protein. Does not look infected.   VITAL SIGNS: Blood pressure 147/71, respirations 18, pulse 76, temperature 98.   ASSESSMENT: This is a 29 year old man with a history of mood instability and depression chronically. Multiple severe acute self mutilations. Still feels hopeless. Very minimal support. Needs hospitalization for safety.   TREATMENT PLAN: Because of circumstances that his girlfriend actually was already admitted to the hospital earlier today, the patient cannot be admitted to our psychiatry ward. We also do not have any beds at the moment. I have requested psychiatry staff refer him out and look for other psychiatric beds in the area. Continue commitment. I have gone ahead and ordered a nicotine patch and inhaler, as well as p.r.n. Seroquel for sleep tonight. Supportive and educational counseling about the plan completed.   DIAGNOSIS, PRINCIPAL AND PRIMARY:  AXIS I: Major depression, severe, recurrent.   SECONDARY DIAGNOSES:  AXIS I: Marijuana abuse, severe. Borderline personality disorder.   AXIS II: Multiple lacerations to his arms.  ____________________________ Audery AmelJohn T. Clapacs, MD jtc:AT D: 07/24/2014 19:53:50 ET T: 07/24/2014 20:05:11 ET JOB#: [id number]  cc: Audery AmelJohn T. Clapacs, MD, <Dictator> Audery AmelJOHN T CLAPACS MD ELECTRONICALLY SIGNED 07/30/2014 22:33

## 2014-08-25 NOTE — Consult Note (Addendum)
PATIENT NAME:  Reginald Bowman, Luisenrique MR#:  [id number] DATE OF BIRTH:  1985/05/27  DATE OF CONSULTATION:  08/07/2014  REFERRING PHYSICIAN:   CONSULTING PHYSICIAN:  Audery AmelJohn T. Cylas Falzone, MD  CONSULTATION UPDATE  IDENTIFYING INFORMATION AND HISTORY OF PRESENT ILLNESS: A 29 year old man who came into the Emergency Room yesterday after an overdose on cold medicine and marijuana. The patient's chief complaint this morning, "I'm all right."   The patient states that since last night, he was able to get some sleep. Mood has improved. He says he still has chronic worries and depression that are the same as usual. He denies any suicidal ideation. He repeats to me, his denial that he was trying to harm himself when overdosed yesterday, that only that he was trying to get high. The patient denies any psychotic symptoms. He continues to have multiple anxieties about having a stable place to live and about his financial situation and legal situation. He is not reporting any psychotic symptoms. He is not currently going to outpatient treatment at his own decision, because he has been referred multiple times.   REVIEW OF SYSTEMS: No new physical complaints. Full physical review of systems negative. Reports mild depression. No hallucinations, no acute suicidal ideation.   MENTAL STATUS EXAMINATION: Disheveled man who looks his stated age or older. Passively cooperative. Eye contact decreased. Psychomotor activity slow. Speech slow. Affect flat. Mood stated as all right. Thoughts lucid without loosening of associations or delusions. Denies hallucinations. Denied suicidal or homicidal ideation. Alert and oriented x 4. Able to make reason decisions, understands pros and cons of his behavior. Baseline fund of knowledge normal.   ASSESSMENT: The patient has had multiple hospitalizations for his personality disorder and substance abuse. At this point, he is denying acute suicidal ideation. The patient is at chronic risk of self injury  by his abuse of drugs, but this is not likely to be changed by inpatient hospitalization, and he is not acutely suicidal or psychotic at this time. Does not meet criteria or require or would benefit from hospitalization.   The case discussed with Dr. Mindi JunkerGottlieb. Involuntary commitment discontinued. The patient counseled for several minutes about the benefits of making a serious effort to get sober. Once again, he will be referred to [facility] in the community and strongly encouraged to get outpatient treatment for substance abuse.   DIAGNOSIS, PRINCIPAL AND PRIMARY:  AXIS I: Depression, not otherwise specified.   SECONDARY DIAGNOSES:  AXIS I: Sedative abuse, opiate abuse, borderline personality disorder.     ____________________________ Audery AmelJohn T. Paris Hohn, MD jtc:JT D: 08/07/2014 10:12:43 ET T: 08/07/2014 10:36:00 ET JOB#: [id number]  cc: Audery AmelJohn T. Balinda Heacock, MD, <Dictator> Audery AmelJOHN T Dajanae Brophy MD ELECTRONICALLY SIGNED 08/30/2014 10:11

## 2014-08-30 ENCOUNTER — Emergency Department
Admission: EM | Admit: 2014-08-30 | Discharge: 2014-09-02 | Disposition: A | Discharge status: Other Acute Inpatient Hospital | Payer: Medicaid Other | Attending: Student | Admitting: Student

## 2014-08-30 DIAGNOSIS — R45851 Suicidal ideations: Secondary | ICD-10-CM | POA: Insufficient documentation

## 2014-08-30 DIAGNOSIS — F1220 Cannabis dependence, uncomplicated: Secondary | ICD-10-CM

## 2014-08-30 DIAGNOSIS — R45850 Homicidal ideations: Secondary | ICD-10-CM | POA: Insufficient documentation

## 2014-08-30 DIAGNOSIS — Z880 Allergy status to penicillin: Secondary | ICD-10-CM | POA: Diagnosis not present

## 2014-08-30 LAB — COMPREHENSIVE METABOLIC PANEL
ALT: 13 U/L — ABNORMAL LOW (ref 17–63)
AST: 17 U/L (ref 15–41)
Albumin: 4.3 g/dL (ref 3.5–5.0)
Alkaline Phosphatase: 132 U/L — ABNORMAL HIGH (ref 38–126)
Anion gap: 8 (ref 5–15)
BILIRUBIN TOTAL: 0.5 mg/dL (ref 0.3–1.2)
BUN: 15 mg/dL (ref 6–20)
CO2: 28 mmol/L (ref 22–32)
Calcium: 9.3 mg/dL (ref 8.9–10.3)
Chloride: 102 mmol/L (ref 101–111)
Creatinine, Ser: 0.9 mg/dL (ref 0.61–1.24)
GLUCOSE: 112 mg/dL — AB (ref 65–99)
POTASSIUM: 3.9 mmol/L (ref 3.5–5.1)
SODIUM: 138 mmol/L (ref 135–145)
Total Protein: 7.7 g/dL (ref 6.5–8.1)

## 2014-08-30 LAB — ACETAMINOPHEN LEVEL

## 2014-08-30 LAB — URINE DRUG SCREEN, QUALITATIVE (ARMC ONLY)
Amphetamines, Ur Screen: NOT DETECTED
BENZODIAZEPINE, UR SCRN: NOT DETECTED
Barbiturates, Ur Screen: NOT DETECTED
CANNABINOID 50 NG, UR ~~LOC~~: POSITIVE — AB
Cocaine Metabolite,Ur ~~LOC~~: NOT DETECTED
MDMA (Ecstasy)Ur Screen: NOT DETECTED
METHADONE SCREEN, URINE: NOT DETECTED
Opiate, Ur Screen: NOT DETECTED
PHENCYCLIDINE (PCP) UR S: NOT DETECTED
Tricyclic, Ur Screen: NOT DETECTED

## 2014-08-30 LAB — CBC
HCT: 44.5 % (ref 40.0–52.0)
HEMOGLOBIN: 15.2 g/dL (ref 13.0–18.0)
MCH: 30.7 pg (ref 26.0–34.0)
MCHC: 34.2 g/dL (ref 32.0–36.0)
MCV: 89.8 fL (ref 80.0–100.0)
PLATELETS: 269 10*3/uL (ref 150–440)
RBC: 4.96 MIL/uL (ref 4.40–5.90)
RDW: 13.7 % (ref 11.5–14.5)
WBC: 9.4 10*3/uL (ref 3.8–10.6)

## 2014-08-30 LAB — ETHANOL: Alcohol, Ethyl (B): <5 mg/dL (ref <5)

## 2014-08-30 LAB — SALICYLATE LEVEL: Salicylate Lvl: <4 mg/dL (ref 2.8–30.0)

## 2014-08-30 NOTE — ED Provider Notes (Signed)
[facility] Emergency Department Provider Note    ____________________________________________  Time seen: ----------------------------------------- 9:52 PM on 08/30/2014 -----------------------------------------    I have reviewed the triage vital signs and the nursing notes.   HISTORY  Chief Complaint Mental Health Problem       HPI Reginald Bowman is a 29 y.o. male with borderline personality disorder, polysubstance abuse presents with suicidal ideation and homicidal ideation. He reports it has been going on for "a while", but is worse since yesterday. He recently broke up with his girlfriend and they've been fighting. He reports he wants to kill her. Location is generalized. Duration chronic but worse over the past day. Timing is constant. Current severity 8 out of 10. He denies any recent changes to medications. Nothing improves his symptoms. He denies any recent illness including no cough, sneezing, runny nose or congestion. He also reports that he has been having a friend of his waterboard him recreationally as this brings him enjoyment, he reports that it is "similar to autoerotic asphyxiation".     No past medical history on file.  There are no active problems to display for this patient.   No past surgical history on file.  No current outpatient prescriptions on file.  Allergies Penicillins and Risperidone and related  No family history on file.  Social History History  Substance Use Topics  . Smoking status: Not on file  . Smokeless tobacco: Not on file  . Alcohol Use: Not on file    Review of Systems  Constitutional: Negative for fever. Eyes: Negative for visual changes. ENT: Negative for sore throat. Cardiovascular: Negative for chest pain. Respiratory: Negative for shortness of breath. Gastrointestinal: Negative for abdominal pain, vomiting and diarrhea. Genitourinary: Negative for dysuria. Musculoskeletal: Negative for  back pain. Skin: Negative for rash. Neurological: Negative for headaches, focal weakness or numbness. Psychiatric:Positive for suicidal and homicidal ideation   10-point ROS otherwise negative.  ____________________________________________   PHYSICAL EXAM:  VITAL SIGNS: ED Triage Vitals  Enc Vitals Group     BP 08/30/14 2113 138/78 mmHg     Pulse Rate 08/30/14 2113 73     Resp 08/30/14 2113 16     Temp 08/30/14 2113 97.6 F (36.4 C)     Temp Source 08/30/14 2113 Oral     SpO2 08/30/14 2113 100 %     Weight 08/30/14 2113 175 lb (79.379 kg)     Height 08/30/14 2113 6' (1.829 m)     Head Cir --      Peak Flow --      Pain Score 08/30/14 2115 5     Pain Loc --      Pain Edu? --      Excl. in GC? --      Constitutional: Alert and oriented. Appears disheveled and generally unkempt, sitting up in bed eating a [place]key sandwich, in no distress. Eyes: Conjunctivae are normal.  Normal extraocular movements. ENT   Head: Normocephalic and atraumatic.   Nose: No congestion/rhinnorhea.   Mouth/Throat: Mucous membranes are moist.   Neck: No stridor. Hematological/Lymphatic/Immunilogical: No cervical lymphadenopathy. Cardiovascular: Normal rate, regular rhythm. Normal and symmetric distal pulses are present in all extremities. No murmurs, rubs, or gallops. Respiratory: Normal respiratory effort without tachypnea nor retractions. Breath sounds are clear and equal bilaterally. No wheezes/rales/rhonchi. Gastrointestinal: Soft and nontender. No distention. No abdominal bruits. There is no CVA tenderness. Genitourinary: deferred Musculoskeletal: Nontender with normal range of motion in all extremities. No joint effusions.  No lower  extremity tenderness nor edema; healing self inflicted cutting superficial wounds to the left upper arm without surrounding erythema, no drainage, no fluctuance Neurologic:  Normal speech and language. No gross focal neurologic deficits are  appreciated. Speech is normal. No gait instability. Skin:  Skin is warm, dry and intact. No rash noted. Psychiatric: Mood is flat and affect is blunted. Speech and behavior are normal.  ____________________________________________    LABS (pertinent positives/negatives)  Notable for mild nonspecific elevation in alkaline phosphatase level.  ____________________________________________   EKG  None  ____________________________________________    RADIOLOGY  None  ____________________________________________   PROCEDURES  Procedure(s) performed: None  Critical Care performed: No  ____________________________________________   INITIAL IMPRESSION / ASSESSMENT AND PLAN / ED COURSE  Pertinent labs & imaging results that were available during my care of the patient were reviewed by me and considered in my medical decision making (see chart for details).  Reginald Bowman is a 29 y.o. male with borderline personality disorder, polysubstance abuse presents with suicidal ideation and homicidal ideation. Vital signs stable, no acute medical complaints. Benign examination. CBC and CMP reviewed by me and are grossly unremarkable, remaining labs pending. Plan for involuntary commitment, psychiatric consult, behavioral health consult. Medically cleared.  ____________________________________________   FINAL CLINICAL IMPRESSION(S) / ED DIAGNOSES  Final diagnoses:  Suicidal ideations  Homicidal ideations     Gayla DossEryka A Ricky Doan, MD 08/30/14 2312

## 2014-08-30 NOTE — ED Notes (Signed)
BEHAVIORAL HEALTH ROUNDING Patient sleeping: No. Patient alert and oriented: yes Behavior appropriate: Yes.  ; If no, describe:  Nutrition and fluids offered: Yes  Toileting and hygiene offered: Yes  Sitter present: yes Law enforcement present: Yes  
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
## 2014-08-30 NOTE — ED Notes (Signed)
Patient assigned to appropriate care area. Patient oriented to unit/care area: Informed that, for their safety, care areas are designed for safety and monitored by security cameras at all times; and visiting hours explained to patient. Patient verbalizes understanding, and verbal contract for safety obtained. 

## 2014-08-30 NOTE — ED Notes (Signed)

## 2014-08-31 DIAGNOSIS — F1220 Cannabis dependence, uncomplicated: Secondary | ICD-10-CM

## 2014-08-31 MED ORDER — DIVALPROEX SODIUM ER 250 MG PO TB24
ORAL_TABLET | ORAL | Status: AC
  Administered 2014-08-31: 500 mg via ORAL
  Filled 2014-08-31: qty 2

## 2014-08-31 MED ORDER — FAMOTIDINE 20 MG PO TABS
ORAL_TABLET | ORAL | Status: AC
  Filled 2014-08-31: qty 1

## 2014-08-31 MED ORDER — FAMOTIDINE 20 MG PO TABS
20.0000 mg | ORAL_TABLET | Freq: Once | ORAL | Status: AC
  Administered 2014-08-31: 20 mg via ORAL

## 2014-08-31 MED ORDER — ARIPIPRAZOLE 5 MG PO TABS
ORAL_TABLET | ORAL | Status: AC
  Administered 2014-08-31: 5 mg via ORAL
  Filled 2014-08-31: qty 1

## 2014-08-31 MED ORDER — DIVALPROEX SODIUM ER 250 MG PO TB24
250.0000 mg | ORAL_TABLET | Freq: Every morning | ORAL | Status: DC
  Administered 2014-09-01 – 2014-09-02 (×2): 250 mg via ORAL
  Filled 2014-08-31 (×2): qty 1

## 2014-08-31 MED ORDER — DIVALPROEX SODIUM ER 250 MG PO TB24
500.0000 mg | ORAL_TABLET | Freq: Every day | ORAL | Status: DC
  Administered 2014-08-31 – 2014-09-01 (×2): 500 mg via ORAL
  Filled 2014-08-31: qty 2

## 2014-08-31 MED ORDER — ALUM & MAG HYDROXIDE-SIMETH 200-200-20 MG/5ML PO SUSP
30.0000 mL | Freq: Once | ORAL | Status: AC
  Administered 2014-08-31: 30 mL via ORAL

## 2014-08-31 MED ORDER — ALUM & MAG HYDROXIDE-SIMETH 200-200-20 MG/5ML PO SUSP
ORAL | Status: AC
  Administered 2014-08-31: 30 mL via ORAL
  Filled 2014-08-31: qty 30

## 2014-08-31 MED ORDER — ARIPIPRAZOLE 5 MG PO TABS
5.0000 mg | ORAL_TABLET | Freq: Once | ORAL | Status: AC
  Administered 2014-08-31: 5 mg via ORAL

## 2014-08-31 MED ORDER — ALUM & MAG HYDROXIDE-SIMETH 200-200-20 MG/5ML PO SUSP
30.0000 mL | Freq: Four times a day (QID) | ORAL | Status: DC | PRN
  Administered 2014-08-31 – 2014-09-02 (×2): 30 mL via ORAL

## 2014-08-31 MED ORDER — ACETAMINOPHEN 325 MG PO TABS
650.0000 mg | ORAL_TABLET | Freq: Four times a day (QID) | ORAL | Status: DC | PRN
  Administered 2014-08-31: 650 mg via ORAL
  Filled 2014-08-31: qty 2

## 2014-08-31 MED ORDER — ALUM & MAG HYDROXIDE-SIMETH 200-200-20 MG/5ML PO SUSP
ORAL | Status: AC
  Filled 2014-08-31: qty 30

## 2014-08-31 NOTE — ED Notes (Signed)

## 2014-08-31 NOTE — ED Notes (Signed)
BEHAVIORAL HEALTH ROUNDING Patient sleeping: Yes.   Patient alert and oriented: not applicable Behavior appropriate: Yes.  ; If no, describe:  Nutrition and fluids offered: No Toileting and hygiene offered: No Sitter present: yes Law enforcement present: Yes   
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
## 2014-08-31 NOTE — ED Notes (Signed)
Pt brought into ED BHU via sally port and wand with metal detector for safety by ODS officer. Patient oriented to unit/care area: Pt informed of unit policies and procedures.  Informed that, for their safety, care areas are designed for safety and monitored by security cameras at all times; and visiting hours explained to patient. Patient verbalizes understanding, and verbal contract for safety obtained.Pt shown to their room.  

## 2014-08-31 NOTE — ED Notes (Signed)
ED BHU PLACEMENT JUSTIFICATION Is the patient under IVC or is there intent for IVC: No. Is the patient medically cleared: Yes.   Is there vacancy in the ED BHU: Yes.   Is the population mix appropriate for patient: Yes.   Is the patient awaiting placement in inpatient or outpatient setting: No. Has the patient had a psychiatric consult: No. Survey of unit performed for contraband, proper placement and condition of furniture, tampering with fixtures in bathroom, shower, and each patient room: Yes.  ; Findings:  APPEARANCE/BEHAVIOR calm and cooperative NEURO ASSESSMENT Orientation: time, place and person Hallucinations: No.None noted (Hallucinations) Speech: Normal Gait: normal RESPIRATORY ASSESSMENT Normal expansion.  Clear to auscultation.  No rales, rhonchi, or wheezing. CARDIOVASCULAR ASSESSMENT regular rate and rhythm, S1, S2 normal, no murmur, click, rub or gallop GASTROINTESTINAL ASSESSMENT soft, nontender, BS WNL, no r/g EXTREMITIES normal strength, tone, and muscle mass, no deformities PLAN OF CARE Provide calm/safe environment. Vital signs assessed twice daily. ED BHU Assessment once each 12-hour shift. Collaborate with intake RN daily or as condition indicates. Assure the ED provider has rounded once each shift. Provide and encourage hygiene. Provide redirection as needed. Assess for escalating behavior; address immediately and inform ED provider.  Assess family dynamic and appropriateness for visitation as needed: Yes.  ; If necessary, describe findings:  Educate the patient/family about BHU procedures/visitation: Yes.  ; If necessary, describe findings:
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
## 2014-08-31 NOTE — ED Notes (Signed)
BEHAVIORAL HEALTH ROUNDING Patient sleeping: No. Patient alert and oriented: yes Behavior appropriate: Yes.  ; If no, describe:  Nutrition and fluids offered: Yes  Toileting and hygiene offered: Yes  Sitter present: not applicable Law enforcement present: Yes  

## 2014-08-31 NOTE — ED Provider Notes (Signed)
-----------------------------------------   11:17 PM on 08/31/2014 -----------------------------------------   BP 132/79 mmHg  Pulse 64  Temp(Src) 98.1 F (36.7 C) (Oral)  Resp 18  Ht 6' (1.829 m)  Wt 175 lb (79.379 kg)  BMI 23.73 kg/m2  SpO2 100%  The patient had no acute events since last update.  Psychiatry recommendation is for inpatient admission. I reviewed the note and ordered a TSH and vitamin D level as per the recommendations. I also ordered Depakote ER and Abilify as per recommendations.     Loleta Roseory Nataliya Graig, MD 08/31/14 [phone]

## 2014-08-31 NOTE — ED Notes (Signed)
BEHAVIORAL HEALTH ROUNDING Patient sleeping: Yes.   Patient alert and oriented: not applicable Behavior appropriate: Yes.  ; If no, describe:  Nutrition and fluids offered: No Toileting and hygiene offered: No Sitter present: not applicable Law enforcement present: Yes  

## 2014-08-31 NOTE — ED Provider Notes (Signed)
-----------------------------------------   12:48 AM on 08/31/2014 -----------------------------------------   BP 138/78 mmHg  Pulse 73  Temp(Src) 97.6 F (36.4 C) (Oral)  Resp 16  Ht 6' (1.829 m)  Wt 175 lb (79.379 kg)  BMI 23.73 kg/m2  SpO2 100%  The patient had no acute events since last update.  Calm and cooperative at this time.  Disposition is pending per Psychiatry/Behavioral Medicine team recommendations.     Sharyn CreamerMark Vada Yellen, MD 08/31/14 [phone]

## 2014-08-31 NOTE — ED Notes (Signed)
BEHAVIORAL HEALTH ROUNDING Patient sleeping: Yes.   Patient alert and oriented: no Behavior appropriate: Yes.  ; If no, describe:  Nutrition and fluids offered: No Toileting and hygiene offered: No Sitter present: not applicable Law enforcement present: Yes  

## 2014-08-31 NOTE — ED Notes (Signed)
BEHAVIORAL HEALTH ROUNDING Patient sleeping: Yes.   Patient alert and oriented: not applicable Behavior appropriate: Yes.  ; If no, describe: SLEEPING Nutrition and fluids offered: No Toileting and hygiene offered: No Sitter present: not applicable Law enforcement present: Yes ODS 

## 2014-08-31 NOTE — ED Notes (Signed)
BEHAVIORAL HEALTH ROUNDING  Patient sleeping: No.  Patient alert and oriented: yes  Behavior appropriate: Yes. ; If no, describe:  Nutrition and fluids offered: Yes  Toileting and hygiene offered: Yes  Sitter present: not applicable  Law enforcement present: Yes ODS  

## 2014-08-31 NOTE — ED Notes (Signed)
Dinner 50%; 240 ml.

## 2014-08-31 NOTE — ED Provider Notes (Signed)
-----------------------------------------   1:34 AM on 08/31/2014 -----------------------------------------    Patient reports that he is having a burning sensation in the chest at present. He just awoke from sleeping. This is to be epigastric in nature and he reports she's had it previously, appears to be consistent with patient's prior gastric reflux. We did obtain an EKG which currently shows normal sinus rhythm   Date: 08/31/2014  Rate: 75  Rhythm: normal sinus rhythm  QRS Axis: normal  Intervals: normal  ST/T Wave abnormalities: normal  Conduction Disutrbances: none  Narrative Interpretation: unremarkable      Sharyn CreamerMark Morganne Haile, MD 08/31/14 0134

## 2014-08-31 NOTE — ED Notes (Signed)
RN informed pt that, for his safety, a sitter would be placed with him per MD recommendation following consult with pt.  Pt began cursing, screaming, hitting the wall, hitting the door, threw pillow to the floor.   He screamed that we already had a camera on him and he wasn't "going to have a mother fucking sitter." Other pts in the dayroom ran to their rooms. SO went to the pt's room and asked him to calm down.  The pt demanded to see the MD.  MD requested that the sitter not be with the pt until the MD had seen him again.  Charge RN informed.

## 2014-08-31 NOTE — ED Notes (Signed)
BEHAVIORAL HEALTH ROUNDING Patient sleeping: No. Patient alert and oriented: yes Behavior appropriate: Yes.  ; If no, describe:  Nutrition and fluids offered: Yes  Toileting and hygiene offered: Yes  Sitter present: not applicable Law enforcement present: Yes ODS/shift 

## 2014-08-31 NOTE — ED Provider Notes (Signed)
-----------------------------------------   2:33 PM on 08/31/2014 -----------------------------------------   BP 133/68 mmHg  Pulse 68  Temp(Src) 97.9 F (36.6 C) (Oral)  Resp 20  Ht 6' (1.829 m)  Wt 175 lb (79.379 kg)  BMI 23.73 kg/m2  SpO2 99%  The patient had no acute events since last update.  Calm and cooperative at this time.  Disposition is pending per Psychiatry/Behavioral Medicine team recommendations.    Arnaldo NatalPaul F Gola Bribiesca, MD 08/31/14 [phone]

## 2014-08-31 NOTE — Progress Notes (Signed)
Patient evaluated by Psych MD with recommendation for inpatient treatment.  [facility] or [facility].  Referral information for inpatient psych Placement has been faxed out to the following [facility]; [phone]), [facility] [phone]), [phone] and [place] [phone]   Sammuel Hineseborah Rozina Pointer. Theresia MajorsLCSWA, MSW Clinical Social Work Department Emergency Room [phone] 5:40 PM

## 2014-08-31 NOTE — ED Notes (Signed)
MD and SO with pt.

## 2014-08-31 NOTE — ED Notes (Signed)
Suicide precautions to be discontinued.

## 2014-08-31 NOTE — ED Notes (Signed)
Pt c/o CP and heartburn.  MD notified.

## 2014-08-31 NOTE — ED Notes (Signed)
Psychiatric consult with MD, SW, and RN. 

## 2014-09-01 LAB — TSH: TSH: 2.829 u[IU]/mL (ref 0.350–4.500)

## 2014-09-01 MED ORDER — IBUPROFEN 600 MG PO TABS: 600.0000 mg | ORAL_TABLET | Freq: Four times a day (QID) | ORAL | Status: DC | PRN

## 2014-09-01 NOTE — ED Notes (Signed)
BEHAVIORAL HEALTH ROUNDING Patient sleeping: Yes.   Patient alert and oriented: not applicable Behavior appropriate: Yes.  ; If no, describe:  Nutrition and fluids offered: Yes  Toileting and hygiene offered: No Sitter present: not applicable Law enforcement present: Yes ODS/shift 

## 2014-09-01 NOTE — ED Provider Notes (Signed)
-----------------------------------------   11:06 PM on 09/01/2014 -----------------------------------------   BP 133/70 mmHg  Pulse 61  Temp(Src) 97.8 F (36.6 C) (Oral)  Resp 17  Ht 6' (1.829 m)  Wt 175 lb (79.379 kg)  BMI 23.73 kg/m2  SpO2 100%  The patient had no acute events since last update.  Calm and cooperative at this time.  Disposition is pending per Psychiatry/Behavioral Medicine team recommendations.  No new psychiatry note was written today.     Loleta Roseory Cardell Rachel, MD 09/01/14 [phone]

## 2014-09-01 NOTE — ED Notes (Signed)
ED BHU PLACEMENT JUSTIFICATION Is the patient under IVC or is there intent for IVC: Yes.   Is the patient medically cleared: Yes.   Is there vacancy in the ED BHU: Yes.   Is the population mix appropriate for patient: Yes.   Is the patient awaiting placement in inpatient or outpatient setting: Yes.   Has the patient had a psychiatric consult: Yes.   Survey of unit performed for contraband, proper placement and condition of furniture, tampering with fixtures in bathroom, shower, and each patient room: Yes.  ; Findings: ALL CLEAR APPEARANCE/BEHAVIOR calm, cooperative and adequate rapport can be established NEURO ASSESSMENT Orientation: time, place and person Hallucinations: No.None noted (Hallucinations) Speech: Normal Gait: normal RESPIRATORY ASSESSMENT RESP EVEN AND UNLABORED, NO DISTRESS NOTED.  CARDIOVASCULAR ASSESSMENTSKIN COLOR NORMAL FOR AGE AND RACE.  GASTROINTESTINAL ASSESSMENT NO ISSUES  EXTREMITIES ROM of all joints is normal PLAN OF CARE Provide calm/safe environment. Vital signs assessed twice daily. ED BHU Assessment once each 12-hour shift. Collaborate with intake RN daily or as condition indicates. Assure the ED provider has rounded once each shift. Provide and encourage hygiene. Provide redirection as needed. Assess for escalating behavior; address immediately and inform ED provider.  Assess family dynamic and appropriateness for visitation as needed: Yes.  ; If necessary, describe findings:  Educate the patient/family about BHU procedures/visitation: Yes.  ; If necessary, describe findings: PT UNDERSTANDING AND ACCEPTING OF UNIT RULES AND PROCEDURES. CALM AND COOPERATIVE AT THIS TIME. WILL CONTINUE TO MONITOR.

## 2014-09-01 NOTE — ED Notes (Signed)

## 2014-09-01 NOTE — ED Notes (Signed)
BEHAVIORAL HEALTH ROUNDING Patient sleeping: Yes.   Patient alert and oriented: not applicable Behavior appropriate: Yes.  ; If no, describe: SLEEPING Nutrition and fluids offered: No Toileting and hygiene offered: No Sitter present: not applicable Law enforcement present: Yes ODS 

## 2014-09-01 NOTE — ED Notes (Signed)
BEHAVIORAL HEALTH ROUNDING Patient sleeping: Yes.   Patient alert and oriented: no Behavior appropriate: Yes.  ; If no, describe:  Nutrition and fluids offered: No Toileting and hygiene offered: No Sitter present: not applicable Law enforcement present: Yes  

## 2014-09-01 NOTE — ED Notes (Signed)
BEHAVIORAL HEALTH ROUNDING Patient sleeping: Yes.   Patient alert and oriented: not applicable Behavior appropriate: Yes.  ; If no, describe:  Nutrition and fluids offered: No Toileting and hygiene offered: No Sitter present: not applicable Law enforcement present: Yes  

## 2014-09-01 NOTE — ED Notes (Signed)
BEHAVIORAL HEALTH ROUNDING Patient sleeping: Yes.   Patient alert and oriented: not applicable Behavior appropriate: No.; If no, describe:  Nutrition and fluids offered: No Toileting and hygiene offered: No Sitter present: not applicable Law enforcement present: Yes  

## 2014-09-01 NOTE — ED Notes (Signed)
Snack and drink provided at bedside.  Silverware removed from tray 

## 2014-09-01 NOTE — ED Provider Notes (Signed)
-----------------------------------------   7:15 AM on 09/01/2014 -----------------------------------------   BP 132/79 mmHg  Pulse 64  Temp(Src) 98.1 F (36.7 C) (Oral)  Resp 18  Ht 6' (1.829 m)  Wt 175 lb (79.379 kg)  BMI 23.73 kg/m2  SpO2 100%  The patient had no acute events since last update.  Calm and cooperative at this time.  Disposition is pending per Psychiatry/Behavioral Medicine team recommendations.     Sharyn CreamerMark Quale, MD 09/01/14 [phone]

## 2014-09-01 NOTE — ED Notes (Signed)

## 2014-09-01 NOTE — ED Notes (Signed)
BEHAVIORAL HEALTH ROUNDING  Patient sleeping: No.  Patient alert and oriented: yes  Behavior appropriate: Yes. ; If no, describe:  Nutrition and fluids offered: Yes  Toileting and hygiene offered: Yes  Sitter present: not applicable  Law enforcement present: Yes ODS  

## 2014-09-02 ENCOUNTER — Inpatient Hospital Stay
Admission: EM | Admit: 2014-09-02 | Discharge: 2014-09-06 | DRG: 885 | Disposition: A | Discharge status: Home / Self Care | Payer: Medicaid Other | Source: Intra-hospital | Attending: Psychiatry | Admitting: Psychiatry

## 2014-09-02 ENCOUNTER — Encounter: Payer: Self-pay | Admitting: Psychiatry

## 2014-09-02 DIAGNOSIS — Z590 Homelessness: Secondary | ICD-10-CM | POA: Diagnosis not present

## 2014-09-02 DIAGNOSIS — F17210 Nicotine dependence, cigarettes, uncomplicated: Secondary | ICD-10-CM | POA: Diagnosis present

## 2014-09-02 DIAGNOSIS — M25559 Pain in unspecified hip: Secondary | ICD-10-CM | POA: Diagnosis present

## 2014-09-02 DIAGNOSIS — F319 Bipolar disorder, unspecified: Secondary | ICD-10-CM | POA: Diagnosis present

## 2014-09-02 DIAGNOSIS — Z9114 Patient's other noncompliance with medication regimen: Secondary | ICD-10-CM | POA: Diagnosis present

## 2014-09-02 DIAGNOSIS — F4310 Post-traumatic stress disorder, unspecified: Secondary | ICD-10-CM | POA: Diagnosis present

## 2014-09-02 DIAGNOSIS — F659 Paraphilia, unspecified: Secondary | ICD-10-CM | POA: Diagnosis present

## 2014-09-02 DIAGNOSIS — F1220 Cannabis dependence, uncomplicated: Secondary | ICD-10-CM | POA: Diagnosis present

## 2014-09-02 DIAGNOSIS — Z915 Personal history of self-harm: Secondary | ICD-10-CM | POA: Diagnosis not present

## 2014-09-02 DIAGNOSIS — F1290 Cannabis use, unspecified, uncomplicated: Secondary | ICD-10-CM | POA: Diagnosis present

## 2014-09-02 DIAGNOSIS — R45851 Suicidal ideations: Secondary | ICD-10-CM | POA: Diagnosis present

## 2014-09-02 DIAGNOSIS — F419 Anxiety disorder, unspecified: Secondary | ICD-10-CM | POA: Diagnosis present

## 2014-09-02 LAB — VITAMIN D 25 HYDROXY (VIT D DEFICIENCY, FRACTURES): Vit D, 25-Hydroxy: 28 ng/mL — ABNORMAL LOW (ref 30.0–100.0)

## 2014-09-02 MED ORDER — ACETAMINOPHEN 325 MG PO TABS
650.0000 mg | ORAL_TABLET | Freq: Four times a day (QID) | ORAL | Status: DC | PRN
  Administered 2014-09-04 (×2): 650 mg via ORAL
  Filled 2014-09-02 (×2): qty 2

## 2014-09-02 MED ORDER — NICOTINE 21 MG/24HR TD PT24: 21.0000 mg | MEDICATED_PATCH | Freq: Every day | TRANSDERMAL | Status: DC

## 2014-09-02 MED ORDER — ZIPRASIDONE MESYLATE 20 MG IM SOLR
INTRAMUSCULAR | Status: AC
  Administered 2014-09-02: 10 mg via INTRAMUSCULAR
  Filled 2014-09-02: qty 20

## 2014-09-02 MED ORDER — FLUOXETINE HCL 20 MG PO CAPS: 40.0000 mg | ORAL_CAPSULE | Freq: Every day | ORAL | Status: DC

## 2014-09-02 MED ORDER — ALUM & MAG HYDROXIDE-SIMETH 200-200-20 MG/5ML PO SUSP
ORAL | Status: AC
  Filled 2014-09-02: qty 30

## 2014-09-02 MED ORDER — MAGNESIUM HYDROXIDE 400 MG/5ML PO SUSP
30.0000 mL | Freq: Every day | ORAL | Status: DC | PRN
Start: 2014-09-02 — End: 2014-09-06

## 2014-09-02 MED ORDER — IBUPROFEN 600 MG PO TABS
600.0000 mg | ORAL_TABLET | Freq: Four times a day (QID) | ORAL | Status: DC | PRN
  Administered 2014-09-03 – 2014-09-05 (×2): 600 mg via ORAL
  Filled 2014-09-02 (×2): qty 1

## 2014-09-02 MED ORDER — FLUOXETINE HCL 20 MG PO CAPS
ORAL_CAPSULE | ORAL | Status: AC
  Filled 2014-09-02: qty 2

## 2014-09-02 MED ORDER — ALUM & MAG HYDROXIDE-SIMETH 200-200-20 MG/5ML PO SUSP
30.0000 mL | Freq: Four times a day (QID) | ORAL | Status: DC | PRN
  Filled 2014-09-02: qty 30

## 2014-09-02 MED ORDER — ALUM & MAG HYDROXIDE-SIMETH 200-200-20 MG/5ML PO SUSP: 30.0000 mL | ORAL | Status: DC | PRN

## 2014-09-02 MED ORDER — ACETAMINOPHEN 325 MG PO TABS: 650.0000 mg | ORAL_TABLET | Freq: Four times a day (QID) | ORAL | Status: DC | PRN

## 2014-09-02 NOTE — ED Provider Notes (Signed)
-----------------------------------------   11:09 PM on 09/02/2014 -----------------------------------------   BP 131/74 mmHg  Pulse 66  Temp(Src) 98 F (36.7 C) (Oral)  Resp 18  Ht 6' (1.829 m)  Wt 175 lb (79.379 kg)  BMI 23.73 kg/m2  SpO2 100%  The patient had no acute events since last update.  Calm and cooperative at this time.  Disposition is pending per Psychiatry/Behavioral Medicine team recommendations.     Rebecka ApleyAllison P Pius Byrom, MD 09/02/14 [phone]

## 2014-09-02 NOTE — ED Notes (Signed)
Report received from [name]. Pt. Alert and oriented in no distress denies SI, HI, AVH and pain.  Pt. Instructed to come to me with problems or concerns.Will continue to monitor for safety via security cameras and Q 15 minute checks. 

## 2014-09-02 NOTE — ED Notes (Signed)
Lunch tray given to pt.  Pt calm and cooperative. In room eating.  

## 2014-09-02 NOTE — ED Notes (Signed)
Pt. Noted in room. Pt. C/o indigestion. No distress or abnormal behavior noted. Will continue to monitor with security cameras. Q 15 minute rounds continue.

## 2014-09-02 NOTE — ED Notes (Signed)
BEHAVIORAL HEALTH ROUNDING Patient sleeping: Yes.   Patient alert and oriented: no Behavior appropriate: Yes.  ; If no, describe:  Nutrition and fluids offered: No Toileting and hygiene offered: No Sitter present: not applicable Law enforcement present: Yes  

## 2014-09-02 NOTE — ED Provider Notes (Addendum)
-----------------------------------------   7:47 AM on 09/02/2014 -----------------------------------------   BP 133/70 mmHg  Pulse 61  Temp(Src) 97.8 F (36.6 C) (Oral)  Resp 17  Ht 6' (1.829 m)  Wt 175 lb (79.379 kg)  BMI 23.73 kg/m2  SpO2 100%  The patient had no acute events since last update.  Calm and cooperative at this time.  Disposition is pending per Psychiatry/Behavioral Medicine team recommendations.     Arelia Longestavid M Nadeem Romanoski, MD 09/02/14 [phone]  Patient just found out that he will be discharged into police custody if discharge. Is having some degree of agitation. Nurse requesting verbal order for as needed 2 mg IM Ativan which I confirmed as appropriate.  Arelia Longestavid M Donalee Gaumond, MD 09/02/14 [phone]

## 2014-09-02 NOTE — ED Notes (Signed)
Pt. Up and using the bathroom. 

## 2014-09-02 NOTE — ED Notes (Signed)
Lunch given to pt

## 2014-09-02 NOTE — ED Notes (Signed)
Pt. Up using bathroom. 

## 2014-09-02 NOTE — ED Notes (Signed)
PT  IVC  ALL  PAPERWORK  ON CHART  PT  SEEN  BY  DR  CLAPACS ON  09/02/14  STATED   PT  CONTINUES  TO MEET  COMMITMENT  CRITERIA WILL ADMIT  WHEN  BED  IS AVAILABLE

## 2014-09-02 NOTE — ED Notes (Signed)
Pt. Noted in day room. No complaints or concerns voiced. No distress or abnormal behavior noted. Will continue to monitor with security cameras. Q 15 minute rounds continue. 

## 2014-09-02 NOTE — Consult Note (Signed)
  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 On interview today the patient continues to state being very depressed and wishing that he were dead.  On review of systems patient continues to say his mood is depressed. Feels fatigued. Feels like he has no support and no where to go and live. Wishes that someone would kill him. Wishes he were dead.  On mental status patient is disheveled and malodorous. Makes no eye contact. Psychomotor activity very limited. Speech quiet decreased in amount. Affect flat and dysphoric. Mood stated as depressed. Thoughts are lucid but slow. No evidence of delusions. Denies hallucinations. Endorses suicidal ideation. Alert and oriented 4 with cooperative with other cognitive testing. Patient had been started on Depakote. Unclear rationale. Patient does not appear to have a history of mania or typical bipolar disorder. More likely to respond to antidepressant focus medicine.  Patient will be started on Prozac 40 mg a day. Discontinue Depakote. Patient continues to meet commitment criteria and he is not appropriate for discharge from the hospital. Will admit to the hospital wants beds are available. Continue monitoring here.  Diagnosis depression not otherwise specified

## 2014-09-02 NOTE — ED Notes (Signed)
BEHAVIORAL HEALTH ROUNDING Patient sleeping: No. Patient alert and oriented: yes Behavior appropriate: No; Pt became aggressive. Pt screaming and hitting walls. Security, Nurse and MD at pts side.  Nutrition and fluids offered: Yes  Toileting and hygiene offered: Yes  Sitter present: 15 min checks  Law enforcement present: Yes

## 2014-09-02 NOTE — ED Notes (Signed)
Pt. Noted in room. No complaints or concerns voiced. No distress or abnormal behavior noted. Will continue to monitor with security cameras. Q 15 minute rounds continue. 

## 2014-09-02 NOTE — ED Notes (Addendum)
BEHAVIORAL HEALTH ROUNDING Patient sleeping: Yes.   Patient alert and oriented: no Behavior appropriate: Yes.  ; If no, describe:  Nutrition and fluids offered: No Toileting and hygiene offered: No Sitter present: not applicable Law enforcement present: Yes  

## 2014-09-02 NOTE — ED Notes (Signed)
Pt. Given breakfast tray at this time. Vitals taken. Pt calm and cooperative at this time.  

## 2014-09-02 NOTE — ED Notes (Signed)
BEHAVIORAL HEALTH ROUNDING Patient sleeping: No. Patient alert and oriented: yes Behavior appropriate: Yes.  ; If no, describe:  Nutrition and fluids offered: Yes  Toileting and hygiene offered: Yes  Sitter present: no Law enforcement present: Yes  

## 2014-09-02 NOTE — ED Notes (Signed)

## 2014-09-02 NOTE — ED Notes (Signed)
Supper trays passed out to pt. Pt in room at this time eating.   

## 2014-09-02 NOTE — ED Notes (Addendum)
BEHAVIORAL HEALTH ROUNDING Patient sleeping: No. Patient alert and oriented: yes Behavior appropriate: Yes.  ;  Nutrition and fluids offered: Yes  Toileting and hygiene offered: Yes  Sitter present: yes Law enforcement present: Yes  

## 2014-09-02 NOTE — ED Notes (Signed)

## 2014-09-02 NOTE — ED Notes (Signed)
BEHAVIORAL HEALTH ROUNDING Patient sleeping: Yes.   Patient alert and oriented: not applicable Behavior appropriate: Yes.   Nutrition and fluids offered: pt sleeping  Toileting and hygiene offered: pt sleeping  Sitter present: 15 min checks  Law enforcement present: Yes  

## 2014-09-02 NOTE — ED Notes (Signed)
BEHAVIORAL HEALTH ROUNDING Patient sleeping: Yes.   Patient alert and oriented:  Behavior appropriate: Yes.   Nutrition and fluids offered: Pt sleeping   Toileting and hygiene offered: Pt sleeping   Sitter present: 15 min checks  Law enforcement present: Yes

## 2014-09-02 NOTE — ED Notes (Signed)
ED BHU PLACEMENT JUSTIFICATION Is the patient under IVC or is there intent for IVC: Yes.   Is the patient medically cleared: Yes.   Is there vacancy in the ED BHU: Yes.   Is the population mix appropriate for patient: Yes.   Is the patient awaiting placement in inpatient or outpatient setting: Yes.   Has the patient had a psychiatric consult: Yes.   Survey of unit performed for contraband, proper placement and condition of furniture, tampering with fixtures in bathroom, shower, and each patient room: Yes.   APPEARANCE/BEHAVIOR calm and cooperative NEURO ASSESSMENT Orientation: time, place and person Hallucinations: Yes.  None noted (Hallucinations) Speech: Normal Gait: normal RESPIRATORY ASSESSMENT Normal expansion.  Clear to auscultation.  No rales, rhonchi, or wheezing., No chest wall tenderness. CARDIOVASCULAR ASSESSMENT regular rate and rhythm, S1, S2 normal, no murmur, click, rub or gallop GASTROINTESTINAL ASSESSMENT soft, nontender, BS WNL, no r/g EXTREMITIES normal strength, tone, and muscle mass PLAN OF CARE Provide calm/safe environment. Vital signs assessed twice daily. ED BHU Assessment once each 12-hour shift. Collaborate with intake RN daily or as condition indicates. Assure the ED provider has rounded once each shift. Provide and encourage hygiene. Provide redirection as needed. Assess for escalating behavior; address immediately and inform ED provider.  Assess family dynamic and appropriateness for visitation as needed: Yes.   Educate the patient/family about BHU procedures/visitation: Yes.

## 2014-09-03 DIAGNOSIS — F4310 Post-traumatic stress disorder, unspecified: Secondary | ICD-10-CM | POA: Diagnosis present

## 2014-09-03 MED ORDER — CARBAMAZEPINE 200 MG PO TABS
200.0000 mg | ORAL_TABLET | Freq: Two times a day (BID) | ORAL | Status: DC
  Administered 2014-09-03 – 2014-09-06 (×6): 200 mg via ORAL
  Filled 2014-09-03 (×6): qty 1

## 2014-09-03 MED ORDER — PRAZOSIN HCL 2 MG PO CAPS
2.0000 mg | ORAL_CAPSULE | Freq: Two times a day (BID) | ORAL | Status: DC
  Administered 2014-09-03 – 2014-09-06 (×6): 2 mg via ORAL
  Filled 2014-09-03 (×6): qty 1

## 2014-09-03 NOTE — Progress Notes (Signed)
RD Assessment  Admitted with: chronic depressive disorder PMHx: Depression  Current Diet: Regular Typical Food/ Fluid Intake: 95-100% of meals recorded per I/O Meal/ Snack Patterns: Unable to assess  Supplements: n/a  Food Allergies: NKFA Food Preferences: Reviewed  Ht: 72" Current weight: 183# BMI: 25.0 Weight Changes: Per MST, pt reports weight loss; reviewed medical records x 1 month ago; pt wt was 180#- no significant weight changes x 1 month.  Labs: Protein Profile:  Recent Labs Lab 08/30/14 2130  ALBUMIN 4.3   Electrolyte and Renal Profile:    Recent Labs Lab 08/30/14 2130  BUN 15  CREATININE 0.90  NA 138  K 3.9    Meds: Reviewed  PES Statement: No nutrition concerns at this time.  Intervention: 1. Meals and Snacks: Cater to patient preferences  Monitoring/ Evaluation: 1. Energy Intake: goal for patient to meet >90% of estimated needs.   LOW Care Level  Reginald Bowman, RDN Pager: [phone] Office: [phone]
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
## 2014-09-03 NOTE — Progress Notes (Signed)
Patient went to bed shortly after we completed admission and he appeared to rest well through out the night.
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
## 2014-09-03 NOTE — [facility] Group Notes (Signed)
[facility] Group Notes:  (Nursing/MHT/Case Management/Adjunct)  Date:  09/03/2014  Time:  11:57 AM  Type of Therapy:  Group Therapy  Participation Level:  Active  Participation Quality:  Appropriate  Affect:  Appropriate  Cognitive:  Appropriate and Oriented  Insight:  Appropriate  Engagement in Group:  Developing/Improving and Engaged  Modes of Intervention:  Discussion and Education  Summary of Progress/Problems:  Reginald Farberamela M Moses Bowman 09/03/2014, 11:57 AM
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
## 2014-09-03 NOTE — Tx Team (Signed)
Initial Interdisciplinary Treatment Plan   PATIENT STRESSORS: Financial difficulties Loss of relationship   PATIENT STRENGTHS: Ability for insight Communication skills General fund of knowledge   PROBLEM LIST: Problem List/Patient Goals Date to be addressed Date deferred Reason deferred Estimated date of resolution  Depression 09/03/14     Suicidal Ideation 09/03/14                                                DISCHARGE CRITERIA:  Improved stabilization in mood, thinking, and/or behavior  PRELIMINARY DISCHARGE PLAN: Outpatient therapy  PATIENT/FAMIILY INVOLVEMENT: This treatment plan has been presented to and reviewed with the patient, Rise PaganiniMatthew Hege, and/or family member.  The patient and family have been given the opportunity to ask questions and make suggestions.  Barbaraann Avans A Sherley Mckenney 09/03/2014, 12:49 AM

## 2014-09-03 NOTE — Plan of Care (Signed)
Problem: Alteration in mood Goal: LTG-Patient reports reduction in suicidal thoughts (Patient reports reduction in suicidal thoughts and is able to verbalize a safety plan for whenever patient is feeling suicidal)  Outcome: Progressing Denies SI presently. Goal: LTG-Pt's behavior demonstrates decreased signs of depression (Patient's behavior demonstrates decreased signs of depression to the point the patient is safe to return home and continue treatment in an outpatient setting)  Outcome: Progressing Patient interacting with peers, states feeling better in terms of his depression, and attending some groups.

## 2014-09-03 NOTE — Progress Notes (Signed)
Recreation Therapy Notes  Date: 05.10.16 Time: 3:00 pm Location: Craft Room  Group Topic: Goal Setting  Goal Area(s) Addresses:  Patient will list at least 3 goals. Patient will list obstacles toward their goals.  Behavioral Response: Attentive, Interactive  Intervention: Recovery Goal Chart  Activity: Patients were instructed to list 3 goals towards their recovery, obstacles towards their goals, the date they started working on their goals, and the date they achieved their goals.  Education: LRT educated patient on goal setting and why it is important.  Education Outcome: Acknowledges education/In group clarification offered  Clinical Observations/Feedback: Patient left group at approximately 3:09 pm with Dr. Demetrius CharityP. Patient returned to group at approximately 3:30 pm. Patient contributed to group discussion by stating how he can celebrate meeting his goals and what he should do after he celebrates his goals.   Reginald Bowman,Reginald Bowman, LRT/CTRS 09/03/2014 4:26 PM

## 2014-09-04 MED ORDER — PANTOPRAZOLE SODIUM 40 MG PO TBEC
40.0000 mg | DELAYED_RELEASE_TABLET | Freq: Every day | ORAL | Status: DC
  Administered 2014-09-04 – 2014-09-06 (×3): 40 mg via ORAL
  Filled 2014-09-04 (×3): qty 1

## 2014-09-04 NOTE — [facility] Group Notes (Signed)
[facility]  09/04/2014 10:08 AM  Participation Quality:  Appropriate and Arrived 20 minutes late  Affect:  Appropriate  Cognitive:  Alert, Appropriate and Oriented  Insight:  Improving  Engagement in Group:  Engaged  Modes of Intervention:  Discussion, Socialization and Support  Summary of Progress/Problems: Pt arrived late to group but participated with no reservations but was appropriate during group discussion. Pt identified "Personal Development" as 'growth'.   Beryl MeagerIngle, Rachna Schonberger T 09/04/2014, 10:08 AM

## 2014-09-04 NOTE — [facility] Group Notes (Signed)
[facility] LCSW Group Therapy  09/04/2014 2:35 PM  Type of Therapy:  Group Therapy  Participation Level:  Did Not Attend  Participation Quality:  n/a  Affect:  n/a  Cognitive:  n/a  Insight:  n/a  Engagement in Therapy:  n/a  Modes of Intervention:  n/a  Summary of Progress/Problems: Pt did not attend group.  Beryl MeagerIngle, Rylend Pietrzak T 09/04/2014, 2:35 PM

## 2014-09-04 NOTE — Plan of Care (Signed)
Problem: [facility] Participation in Recreation Therapeutic Interventions Goal: STG-Patient will identify at least five coping skills for ** STG: Coping Skills - Within 3 treatment sessions, patient will verbalize at least 5 coping skills for substance abuse in each of 2 treatment sessions to decrease substance abuse post d/c.  Outcome: Progressing Treatment Session 1; Completed 1 out of 2: At approximately 4:35 pm. LRT met with patient in craft room. Patient verbalized 5 coping skills for substance abuse. LRT educated patient on leisure and why it is important to implement it into his schedule. LRT provided patient with blank schedules to assist with planning his day and avoiding using substances. LRT educated patient on healthy support systems and why they are important. Patient reported treatment session was helpful.  Leonette Monarch, LRT/CTRS 05.11.16 5:00 pm Goal: STG-Other Recreation Therapy Goal (Specify) STG: Stress Management - Within 5 treatment sessions, patient will demonstrate at least one stress management technique in each of 2 treatment sessions to increase stress management skills post d/c.  Outcome: Progressing Treatment Session 1; Completed 0 out of 2: At approximately 4:35 pm, LRT met with patient in craft room. LRT educated and provided patient with handouts on stress management techniques. Patient verbalized understanding. LRT encouraged patient to read over and practice the stress management techniques.  Leonette Monarch, LRT/CTRS 05.11.16 5:02 pm

## 2014-09-04 NOTE — Progress Notes (Signed)
D: Pt denies SI/HI/AV. Pt is pleasant and cooperative,ruminating on a relationship loss with girlfriend. Pt goal for today is to work on his coping skills and be more positive.   A: Pt was offered support and encouragement. Pt was given scheduled medications. Pt was encourage to attend groups. Q 15 minute checks were done for safety.  R:Pt attends groups and interacts well with peers and staff. Pt is taking medication. Pt has no complaints.Pt was open to treatment and safety maintained on unit.

## 2014-09-04 NOTE — Progress Notes (Signed)
Patient is alert and oriented x 4. Mood has been labile with several outbursts when he did not get his way.  Patient was redirectable.  Patient has been medication and group compliant. He stated that he has a court date that is pending and called his lawyer today.  He denies suicidal thoughts when asked.  Also reported that he was  "extreemly angry at his ex girlfriend" but  Denied homicidal thoughts.  Will cont to monitor for safety.

## 2014-09-04 NOTE — Progress Notes (Signed)
Recreation Therapy Notes  Date: 05.11.16 Time: 3:00 pm Location: Craft Room  Group Topic: Self-esteem  Goal Area(s) Addresses:  Patient will be able to identify benefit of self-esteem. Patient will be able to identify ways to increase self-esteem.  Behavioral Response: Attentive, Interactive  Intervention: Self Portrait  Activity: Patients were instructed to draw a self-portrait. Afterwards, patients were instructed to write their name on a piece of paper and something positive about themselves. Patients wrote positive traits about peers. Then patients drew their self-portraits after reading the positive traits peers wrote.  Education: LRT educated patient on self-esteem and ways to increase their self-esteem.   Education Outcome: Acknowledges education/In group clarification offered   Clinical Observations/Feedback: Patient drew both self-portraits. Patient wrote a positive trait about self and peers. Patient contributed to group discussion by stating how his faces were different, how he felt after reading the positive traits his peers wrote, and that it was easier to write positive traits about peers instead of himself.   Jacquelynn CreeGreene,Nasha Diss M, LRT/CTRS 09/04/2014 4:20 PM

## 2014-09-04 NOTE — Plan of Care (Signed)
Problem: [facility] Patient Education Outcome: Progressing Patient is interacting appropriately compliant with treatment plan.
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
## 2014-09-05 NOTE — Plan of Care (Signed)
Problem: [facility] Participation in Recreation Therapeutic Interventions Goal: STG-Patient will identify at least five coping skills for ** STG: Coping Skills - Within 3 treatment sessions, patient will verbalize at least 5 coping skills for substance abuse in each of 2 treatment sessions to decrease substance abuse post d/c.  Outcome: Completed/Met Date Met:  09/05/14 Treatment Session 2; Completed 2 out of 2: At approximately 2:05 pm, LRT met with patient in patient room. Patient verbalized 5 coping skills for substance abuse.  Leonette Monarch, LRT/CTRS 05.12.16 2:35 Goal: STG-Other Recreation Therapy Goal (Specify) STG: Stress Management - Within 5 treatment sessions, patient will demonstrate at least one stress management technique in each of 2 treatment sessions to increase stress management skills post d/c.  Outcome: Progressing Treatment Session 2; Completed 1 out of 2: At approximately 2:05 pm, LRT met with patient in patient room. Patient demonstrated one stress management technique. Patient reported the technique had been helpful. LRT encouraged patient to continue practicing the stress management techniques.  Leonette Monarch, LRT/CTRS 05.12.16 2:38 pm

## 2014-09-05 NOTE — Progress Notes (Signed)
Patient  Voice of having no where to go . Continue to have an unsteady gait . Voice of his back hurting . Instructed patient Clinical research associatewriter will question  MD on pain medications . Patient  Noted to carry pillow around  With him during shift . Voice of not getting into the narcotic medication , because he states he is aware of his addicting  Personality . Appetite good . Appropriate ADL'S and personal chores. Patient denies suicidal ideation  And Depression scale 0 ( low 0-10 high). Interacting with his peers and staff.  Appetite good

## 2014-09-05 NOTE — Progress Notes (Signed)
Calm and cooperative. Describes his depression level as 0. C/o back pain. Pain scale 8/10. Tylenol 650 mg given as ordered PRN. Med compliant. No voiced thoughts of hurting himself. Denies AV/H. No behavior problems noted. Will continue to monitor.

## 2014-09-05 NOTE — Plan of Care (Signed)
Problem: Ineffective individual coping Goal: STG: Patient will remain free from self harm Outcome: Progressing Calm and cooperative. Flat affect. No injuries noted. Pt states he slept well last night. Pt participate in groups. Interacting with staff and peers. No voiced thoughts of hurting himself. q 15 min checks maintained for safety.

## 2014-09-05 NOTE — Plan of Care (Signed)
Problem: Consults Goal: Suicide Risk Patient Education (See Patient Education module for education specifics)  Outcome: Not Met (add Reason) No injuries noted. Med compliant. No voiced thoughts of hurting himself. q 15 min checks maintained fore safety.
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
## 2014-09-05 NOTE — Progress Notes (Signed)
Recreation Therapy Notes  Date: 05.12.16 Time: 3:15 pm Location: Craft Room  Group Topic: Leisure Educaion  Goal Area(s) Addresses:  Patient ill identify activities for each letter of the alphabet. Patient will verbalize ability to integrate positive leisure into life post d/c. Patient will verbalize ability to use leisure as a coping mechanism.  Behavioral Response: Attentive, Interactive  Intervention: Leisure Alphabet  Activity: Patients were given a leisure Information systems manageralphabet worksheet and instructed to list healthy leisure activities for each letter of the alphabet.  Education: LRT educated patients on leisure and why it is important to use it as a Associate Professorcoping skill.  Education Outcome: Acknowledges education/In group clarification offered  Clinical Observations/Feedback: Patient completed worksheet. Patient contributed to group discussion by listing leisure activities, what he needs to participate in leisure, how he can integrate leisure into his life, and why it is important to use leisure as a Associate Professorcoping skill.   Jacquelynn CreeGreene,Vernice Mannina M, LRT/CTRS 09/05/2014 4:33 PM

## 2014-09-05 NOTE — Plan of Care (Signed)
Problem: Ineffective individual coping Goal: LTG: Patient will report a decrease in negative feelings Verbalizing  Feels in unit programing  Goal: LTG-Other (Specify)- Listing coping skills

## 2014-09-05 NOTE — Tx Team (Signed)
Interdisciplinary Treatment Plan Update (Adult)  Date:  09/05/2014 Time Reviewed:  10:39 AM  Progress in Treatment: Attending groups: Yes. Participating in groups:  Yes. Taking medication as prescribed:  Yes. Tolerating medication:  Yes. Family/Significant othe contact made:  No, will contact:  when given consent Patient understands diagnosis:  Yes. Discussing patient identified problems/goals with staff:  Yes. Medical problems stabilized or resolved:  Yes. Denies suicidal/homicidal ideation: No Issues/concerns per patient self-inventory:  None Other:  New problem(s) identified: Yes,   Discharge Plan or Barriers: homeless, [place] [facility] for [facility], pt will need follow up care arrangement at discharge   Reason for Continuation of Hospitalization: adjusting medications  Comments:  Estimated length of stay: up to 1 day  New goal(s):  Review of initial/current patient goals per problem list:  SEE PLAN OF CARE  Attendees: Patient:  Reginald Bowman 5/12/201610:39 AM  Family:   5/12/201610:39 AM  Physician:  Kristine LineaJolanta Pucilowska, MD 5/12/201610:39 AM  Nursing:    5/12/201610:39 AM  Case Manager:   5/12/201610:39 AM  Counselor: Jake SharkSara Laws, LCSW  5/12/201610:39 AM  Other:  Beryl MeagerJason Mckena Chern, LCSWA 5/12/201610:39 AM  Other: Delorse LekLynn Washington, LCSW 5/12/201610:39 AM  Other:  Hershal CoriaBeth Greene, LRT 5/12/201610:39 AM  Other: Philis Nettleorilynn Terry, RN 5/12/201610:39 AM  Other: Nile RiggsGwynn Farrish, RN 5/12/201610:39 AM  Other:  5/12/201610:39 AM  Other:  5/12/201610:39 AM  Other:  5/12/201610:39 AM  Other:  5/12/201610:39 AM  Other:   5/12/201610:39 AM   Scribe for Treatment Team:   Beryl MeagerIngle, Crystian Frith T, 09/05/2014, 10:39 AM

## 2014-09-05 NOTE — [facility] Group Notes (Signed)
[facility] Group Notes:  (Nursing/MHT/Case Management/Adjunct)  Date:  09/05/2014  Time:  12:06 PM  Type of Therapy:  Psychoeducational Skills  Participation Level:  Active  Participation Quality:  Appropriate, Attentive and Sharing  Affect:  Appropriate  Cognitive:  Appropriate  Insight:  Appropriate  Engagement in Group:  Engaged  Modes of Intervention:  Discussion and Education  Summary of Progress/Problems:  Lynelle SmokeCara Bowman [facility] 09/05/2014, 12:06 PM

## 2014-09-05 NOTE — [facility] Group Notes (Signed)
[facility] Group Notes:  (Nursing/MHT/Case Management/Adjunct)  Date:  09/05/2014  Time:  1:34 AM  Type of Therapy:  Group Therapy  Participation Level:  Active  Participation Quality:  Appropriate  Affect:  Appropriate  Cognitive:  Alert  Insight:  Good  Engagement in Group:  Engaged  Modes of Intervention:  Wrap-up Group  Summary of Progress/Problems:  [employer]VR IncChelsea Nanta Araina Butrick 09/05/2014, 1:34 AM

## 2014-09-06 MED ORDER — CARBAMAZEPINE 200 MG PO TABS: 200.0000 mg | ORAL_TABLET | Freq: Two times a day (BID) | ORAL | Status: DC

## 2014-09-06 MED ORDER — PANTOPRAZOLE SODIUM 40 MG PO TBEC: 40.0000 mg | DELAYED_RELEASE_TABLET | Freq: Every day | ORAL | Status: DC

## 2014-09-06 MED ORDER — PRAZOSIN HCL 2 MG PO CAPS: 2.0000 mg | ORAL_CAPSULE | Freq: Two times a day (BID) | ORAL | Status: DC

## 2014-09-06 MED ORDER — PRAZOSIN HCL 2 MG PO CAPS
2.0000 mg | ORAL_CAPSULE | Freq: Two times a day (BID) | ORAL | Status: DC
Start: 2014-09-06 — End: 2014-09-06

## 2014-09-06 NOTE — [facility] Group Notes (Signed)
[facility] Group Notes:  (Nursing/MHT/Case Management/Adjunct)  Date:  09/06/2014  Time:  12:51 PM  Type of Therapy:  Psychoeducational Skills  Participation Level:  Active  Participation Quality:  Appropriate  Affect:  Appropriate  Cognitive:  Appropriate  Insight:  Appropriate  Engagement in Group:  Engaged  Modes of Intervention:  Discussion and Education  Summary of Progress/Problems:  Reginald Bowman 09/06/2014, 12:51 PM

## 2014-09-06 NOTE — [facility] Suicide Risk Assessment (Signed)
[facility]   Demographic Factors:  Male, Caucasian and Low socioeconomic status  Total Time spent with patient: 30 minutes  Musculoskeletal: Strength & Muscle Tone: within normal limits Gait & Station: normal Patient leans: N/A  Psychiatric Specialty Exam: Physical Exam  Nursing note and vitals reviewed. GI: There is no tenderness.  Musculoskeletal: He exhibits tenderness.    Review of Systems  Musculoskeletal: Positive for joint pain.  All other systems reviewed and are negative.   Blood pressure 144/80, pulse 84, temperature 98.3 F (36.8 C), temperature source Oral, resp. rate 20, height 6' (1.829 m), weight 83.416 kg (183 lb 14.4 oz), SpO2 100 %.Body mass index is 24.94 kg/(m^2).  General Appearance: Casual  Eye Contact::  Good  Speech:  Normal Rate409  Volume:  Normal  Mood:  Euthymic  Affect:  Appropriate  Thought Process:  Goal Directed and Logical  Orientation:  Full (Time, Place, and Person)  Thought Content:  WDL  Suicidal Thoughts:  No  Homicidal Thoughts:  No  Memory:  Immediate;   Fair Recent;   Fair Remote;   Fair  Judgement:  Fair  Insight:  Fair  Psychomotor Activity:  Normal  Concentration:  Fair  Recall:  FiservFair  Fund of Knowledge:Fair  Language: Fair  Akathisia:  No  Handed:  Right  AIMS (if indicated):     Assets:  Communication Skills Desire for Improvement Financial Resources/Insurance Resilience  Sleep:  Number of Hours: 6.15  Cognition: WNL  ADL's:  Intact   Have you used any form of tobacco in the last 30 days? (Cigarettes, Smokeless Tobacco, Cigars, and/or Pipes): No  Has this patient used any form of tobacco in the last 30 days? (Cigarettes, Smokeless Tobacco, Cigars, and/or Pipes) Yes, A prescription for an FDA-approved tobacco cessation medication was offered at discharge and the patient refused  Mental Status Per Nursing Assessment::   On Admission:     Current Mental Status by Physician: NA  Loss  Factors: Financial problems/change in socioeconomic status  Historical Factors: Prior suicide attempts  Risk Reduction Factors:   NA  Continued Clinical Symptoms:  Bipolar Disorder:   Depressive phase  Cognitive Features That Contribute To Risk:  None    Suicide Risk:  Minimal: No identifiable suicidal ideation.  Patients presenting with no risk factors but with morbid ruminations; may be classified as minimal risk based on the severity of the depressive symptoms  Principal Problem: Bipolar I disorder, most recent episode depressed Discharge Diagnoses:  Patient Active Problem List   Diagnosis Date Noted  . Bipolar I disorder, most recent episode depressed [F31.30] 09/03/2014    Priority: High  . Post traumatic stress disorder (PTSD) [F43.10] 09/03/2014    Priority: Medium  . Nicotine use disorder [F17.200] 08/31/2014    Priority: Medium  . Cannabis use disorder, severe, dependence [F12.20] 08/31/2014    Priority: Medium  . Unspecified Depressive disorder [F32.9] 08/31/2014  . Unspecified anxiety disorder [F41.9] 08/31/2014  . 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 Go on 09/11/2014.   Why:  Hospital Follow up, Outpatient Medication Management, Therapy   Contact information:   [address] [place] [address] [phone]; [phone]      Plan Of Care/Follow-up recommendations:  Activity:  as tolerated Diet:  regular Other:  keep follow up appointmnts.  Is patient on multiple antipsychotic therapies at discharge:  No   Has Patient had three or more failed trials of  antipsychotic monotherapy by history:  No  Recommended Plan for Multiple Antipsychotic Therapies: NA    Rakiya Krawczyk 09/06/2014, 9:28 AM

## 2014-09-06 NOTE — Progress Notes (Signed)
Recreation Therapy Notes  INPATIENT RECREATION TR PLAN  Patient Details Name: Reginald Bowman MRN: [id number] DOB: July 09, 1985 Today's Date: 09/06/2014  Rec Therapy Plan Is patient appropriate for Therapeutic Recreation?: Yes Treatment times per week: At least 3 times a week TR Treatment/Interventions: 1:1 session, Group participation (Comment) (Appropriate participation in daily recreation therapy tx)  Discharge Criteria Pt will be discharged from therapy if:: Discharged Treatment plan/goals/alternatives discussed and agreed upon by:: Patient/family  Discharge Summary Short term goals set: See Care Plan Short term goals met: Complete Progress toward goals comments: One-to-one attended Which groups?: Self-esteem, Leisure education, Goal setting One-to-one attended: Stress management and coping skills Reason goals not met: N/A Therapeutic equipment acquired: None Reason patient discharged from therapy: Discharge from hospital Pt/family agrees with progress & goals achieved: Yes Date patient discharged from therapy: 09/06/14   Leonette Monarch, LRT/CTRS 09/06/2014, 12:17 PM

## 2014-09-06 NOTE — Progress Notes (Signed)
Pt c/o back pain. Ibuprofen 600 mg given as ordered for pain. Attends group. Med compliant. Interacting with peers and staff. Pt worried about living arrangements upon discharge. States he'll be staying with a friend and ex-girlfriend. Pt encouraged to express his feelings. Clinical support provided. Denies SI/HI. No AV/H noted. No behavior problems noted.
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
## 2014-09-06 NOTE — Progress Notes (Signed)
  [facility] Adult Case Management Discharge Plan :  Will you be returning to the same living situation after discharge:  Yes,  with a friend At discharge, do you have transportation home?: No. Will send to friends by cab Do you have the ability to pay for your medications: Yes,  Medicaid, although reports he does not have money for copay at this time  Release of information consent forms completed and in the chart;  Patient's signature needed at discharge.  Patient to Follow up at: Follow-up Information    Follow up with [facility]. Go on 09/11/2014.   Why:  Hospital Follow up, Outpatient Medication Management, Therapy   Contact information:   [address] [place] [address] [phone]; fax-[phone]      Patient denies SI/HI: Yes,       Safety Planning and Suicide Prevention discussed: Yes,  with patient, did not agree to allow contact with family or friends  Have you used any form of tobacco in the last 30 days? (Cigarettes, Smokeless Tobacco, Cigars, and/or Pipes): No  Has patient been referred to the Quitline?: Patient refused referral  Glennon MacLaws, Sharisa Toves P, LCSW 09/06/2014, 9:21 AM

## 2014-09-06 NOTE — Plan of Care (Signed)
Problem: [facility] Participation in Recreation Therapeutic Interventions Goal: STG-Other Recreation Therapy Goal (Specify) STG: Stress Management - Within 5 treatment sessions, patient will demonstrate at least one stress management technique in each of 2 treatment sessions to increase stress management skills post d/c.  Outcome: Completed/Met Date Met:  09/06/14 Treatment Session 3; Completed 2 out of 2: At approximately 11:40 am, LRT met with patient in patient room. Patient demonstrated one stress management technique. LRT encouraged patient to continue practicing the stress management techniques.  Leonette Monarch, LRT/CTRS 05.13.16 12:15 pm

## 2014-09-06 NOTE — Progress Notes (Signed)
First am patient is irritable stating that he is ready to go home. Verbalized that he did not want to wait for his seven day supply of medication because he would not be able to afford his medications.   Discharge instructions given.  Verbalized understanding.  Personal belongings returned and prescriptions given. Escorted off unit by a staff member to the main entrance to meet taxi to travel home.

## 2014-09-18 DIAGNOSIS — Y998 Other external cause status: Secondary | ICD-10-CM | POA: Diagnosis not present

## 2014-09-18 DIAGNOSIS — F4310 Post-traumatic stress disorder, unspecified: Secondary | ICD-10-CM | POA: Diagnosis not present

## 2014-09-18 DIAGNOSIS — Y9289 Other specified places as the place of occurrence of the external cause: Secondary | ICD-10-CM | POA: Insufficient documentation

## 2014-09-18 DIAGNOSIS — Z720 Tobacco use: Secondary | ICD-10-CM | POA: Insufficient documentation

## 2014-09-18 DIAGNOSIS — Z79899 Other long term (current) drug therapy: Secondary | ICD-10-CM | POA: Diagnosis not present

## 2014-09-18 DIAGNOSIS — Z880 Allergy status to penicillin: Secondary | ICD-10-CM | POA: Insufficient documentation

## 2014-09-18 DIAGNOSIS — X788XXA Intentional self-harm by other sharp object, initial encounter: Secondary | ICD-10-CM | POA: Insufficient documentation

## 2014-09-18 DIAGNOSIS — S80811A Abrasion, right lower leg, initial encounter: Secondary | ICD-10-CM | POA: Diagnosis not present

## 2014-09-18 DIAGNOSIS — F315 Bipolar disorder, current episode depressed, severe, with psychotic features: Secondary | ICD-10-CM | POA: Diagnosis not present

## 2014-09-18 DIAGNOSIS — S50811A Abrasion of right forearm, initial encounter: Secondary | ICD-10-CM | POA: Diagnosis not present

## 2014-09-18 DIAGNOSIS — Y9389 Activity, other specified: Secondary | ICD-10-CM | POA: Insufficient documentation

## 2014-09-18 DIAGNOSIS — F99 Mental disorder, not otherwise specified: Secondary | ICD-10-CM | POA: Diagnosis present

## 2014-09-19 ENCOUNTER — Encounter: Payer: Self-pay | Admitting: Emergency Medicine

## 2014-09-19 ENCOUNTER — Emergency Department
Admission: EM | Admit: 2014-09-19 | Discharge: 2014-09-20 | Disposition: A | Discharge status: Home / Self Care | Payer: Medicaid Other | Attending: Emergency Medicine | Admitting: Emergency Medicine

## 2014-09-19 DIAGNOSIS — IMO0002 Reserved for concepts with insufficient information to code with codable children: Secondary | ICD-10-CM

## 2014-09-19 DIAGNOSIS — Z7289 Other problems related to lifestyle: Secondary | ICD-10-CM

## 2014-09-19 DIAGNOSIS — F315 Bipolar disorder, current episode depressed, severe, with psychotic features: Secondary | ICD-10-CM

## 2014-09-19 DIAGNOSIS — F603 Borderline personality disorder: Secondary | ICD-10-CM

## 2014-09-19 DIAGNOSIS — F4310 Post-traumatic stress disorder, unspecified: Secondary | ICD-10-CM

## 2014-09-19 DIAGNOSIS — F1220 Cannabis dependence, uncomplicated: Secondary | ICD-10-CM | POA: Diagnosis present

## 2014-09-19 HISTORY — DX: Dorsalgia, unspecified: M54.9

## 2014-09-19 HISTORY — DX: Other chronic pain: G89.29

## 2014-09-19 LAB — URINE DRUG SCREEN, QUALITATIVE (ARMC ONLY)
Amphetamines, Ur Screen: NOT DETECTED
Barbiturates, Ur Screen: NOT DETECTED
Benzodiazepine, Ur Scrn: NOT DETECTED
Cannabinoid 50 Ng, Ur ~~LOC~~: NOT DETECTED
Cocaine Metabolite,Ur ~~LOC~~: NOT DETECTED
MDMA (Ecstasy)Ur Screen: NOT DETECTED
Methadone Scn, Ur: NOT DETECTED
Opiate, Ur Screen: NOT DETECTED
PHENCYCLIDINE (PCP) UR S: NOT DETECTED
Tricyclic, Ur Screen: NOT DETECTED

## 2014-09-19 LAB — COMPREHENSIVE METABOLIC PANEL
ALK PHOS: 85 U/L (ref 38–126)
ALT: 16 U/L — AB (ref 17–63)
AST: 19 U/L (ref 15–41)
Albumin: 4.8 g/dL (ref 3.5–5.0)
Anion gap: 9 (ref 5–15)
BILIRUBIN TOTAL: 0.6 mg/dL (ref 0.3–1.2)
BUN: 16 mg/dL (ref 6–20)
CALCIUM: 10 mg/dL (ref 8.9–10.3)
CO2: 29 mmol/L (ref 22–32)
CREATININE: 1.01 mg/dL (ref 0.61–1.24)
Chloride: 102 mmol/L (ref 101–111)
GFR calc non Af Amer: >60 mL/min (ref >60)
Glucose, Bld: 91 mg/dL (ref 65–99)
Potassium: 4.2 mmol/L (ref 3.5–5.1)
SODIUM: 140 mmol/L (ref 135–145)
Total Protein: 8.2 g/dL — ABNORMAL HIGH (ref 6.5–8.1)

## 2014-09-19 LAB — URINALYSIS COMPLETE WITH MICROSCOPIC (ARMC ONLY)
Bilirubin Urine: NEGATIVE
Glucose, UA: NEGATIVE mg/dL
HGB URINE DIPSTICK: NEGATIVE
Leukocytes, UA: NEGATIVE
NITRITE: NEGATIVE
SPECIFIC GRAVITY, URINE: 1.033 — AB (ref 1.005–1.030)
pH: 5 (ref 5.0–8.0)

## 2014-09-19 LAB — CBC
HCT: 47.3 % (ref 40.0–52.0)
HEMOGLOBIN: 15.9 g/dL (ref 13.0–18.0)
MCH: 30.3 pg (ref 26.0–34.0)
MCHC: 33.7 g/dL (ref 32.0–36.0)
MCV: 90.1 fL (ref 80.0–100.0)
Platelets: 193 10*3/uL (ref 150–440)
RBC: 5.25 MIL/uL (ref 4.40–5.90)
RDW: 13.5 % (ref 11.5–14.5)
WBC: 10.8 10*3/uL — AB (ref 3.8–10.6)

## 2014-09-19 LAB — SALICYLATE LEVEL

## 2014-09-19 LAB — ACETAMINOPHEN LEVEL

## 2014-09-19 LAB — ETHANOL: Alcohol, Ethyl (B): <5 mg/dL (ref <5)

## 2014-09-19 NOTE — ED Notes (Signed)
Patient observed lying in bed with eyes closed  Even, unlabored respirations observed   NAD pt appears to be sleeping  I will continue to monitor along with every 15 minute visual observations and ongoing security camera monitoring    

## 2014-09-19 NOTE — ED Notes (Signed)
Lunch provided   Patient observed lying in bed with eyes closed  Even, unlabored respirations observed   NAD pt appears to be sleeping  I will continue to monitor along with every 15 minute visual observations and ongoing security camera monitoring    

## 2014-09-19 NOTE — ED Notes (Signed)
PT moved from subwait to ED BHU 2.

## 2014-09-19 NOTE — ED Notes (Signed)
Report received from Amy Teague RN. Pt. Sleeping, respirations regular and unlabored.  Will continue to monitor for safety via security cameras and Q 15 minute checks. 

## 2014-09-19 NOTE — ED Notes (Signed)
Pt. Noted sleeping in room. No complaints or concerns voiced. No distress or abnormal behavior noted. Will continue to monitor with security cameras. Q 15 minute rounds continue. 

## 2014-09-19 NOTE — ED Notes (Signed)
Patient ambulatory to triage with steady gait, without difficulty or distress noted; pt reports "here for psych reasons; I want to destroy myself; got this rap group I'm following and their lifestyle has really rubbed off of me; I'm just embracing the evil"

## 2014-09-19 NOTE — ED Notes (Signed)

## 2014-09-19 NOTE — ED Notes (Signed)
No meds ordered at this time Pt observed with no unusual behavior  Appropriate to stimulation  No verbalized needs or concerns at this time  NAD assessed  Continue to monitor

## 2014-09-19 NOTE — ED Notes (Signed)
BEHAVIORAL HEALTH ROUNDING Patient sleeping: No. Patient alert and oriented: yes Behavior appropriate: Yes.  ; If no, describe:  Nutrition and fluids offered: yes Toileting and hygiene offered: Yes  Sitter present: q15 minute observations and security camera monitoring Law enforcement present: Yes  ODS  

## 2014-09-19 NOTE — ED Notes (Signed)
BEHAVIORAL HEALTH ROUNDING Patient sleeping: Yes.   Patient alert and oriented: eyes closed  Appears asleep Behavior appropriate: Yes.  ; If no, describe:  Nutrition and fluids offered: Yes  Toileting and hygiene offered: sleeping Sitter present: q 15 minute observations and security camera monitoring Law enforcement present: yes  ODS 

## 2014-09-19 NOTE — ED Notes (Signed)
Supper provided  Pt observed with no unusual behavior  Appropriate to stimulation  No verbalized needs or concerns at this time  NAD assessed  Continue to monitor 

## 2014-09-19 NOTE — ED Notes (Addendum)
Pt here for depression and suicidal ideation, superficial abrasions noted to forearm hx of suicide attempt in the past.  Pt able to contract for safety at this time, calm and cooperative.  States recent loss of relationship and states "depression is in my genes".  Denies any alcohol use, and uses marijuana on occasion.

## 2014-09-19 NOTE — ED Notes (Signed)
VOL/Consult complete/pending admission

## 2014-09-19 NOTE — ED Notes (Signed)
ED BHU PLACEMENT JUSTIFICATION Is the patient under IVC or is there intent for IVC: No. Is the patient medically cleared: Yes.   Is there vacancy in the ED BHU: yes Is the population mix appropriate for patient: Yes.   Is the patient awaiting placement in inpatient or outpatient setting: No. Has the patient had a psychiatric consult: No. Survey of unit performed for contraband, proper placement and condition of furniture, tampering with fixtures in bathroom, shower, and each patient room: Yes.  ; Findings:  APPEARANCE/BEHAVIOR Calm and cooperative NEURO ASSESSMENT Orientation:  Oriented x3 Hallucinations: No.None noted (Hallucinations) Speech: Normal Gait: normal RESPIRATORY ASSESSMENT Even  unlabored respirations noted  CARDIOVASCULAR ASSESSMENT Regular rate  Pulses equal  Skin warm and dry   GASTROINTESTINAL ASSESSMENT no GI complaint EXTREMITIES Full ROM  PLAN OF CARE Provide calm/safe environment. Vital signs assessed twice daily. ED BHU Assessment once each 12-hour shift. Collaborate with intake RN daily or as condition indicates. Assure the ED provider has rounded once each shift. Provide and encourage hygiene. Provide redirection as needed. Assess for escalating behavior; address immediately and inform ED provider.  Assess family dynamic and appropriateness for visitation as needed: Yes.  ; If necessary, describe findings:  Educate the patient/family about BHU procedures/visitation: Yes.  ; If necessary, describe findings:   

## 2014-09-19 NOTE — ED Notes (Signed)
Breakfast provided    Pt rolled over and stated  "Thank you"  NAd observed  No verbalized needs at this time  Continue to monitor

## 2014-09-19 NOTE — ED Notes (Signed)
Clapacs in consulting with him at this time  

## 2014-09-19 NOTE — ED Provider Notes (Signed)
-----------------------------------------   7:20 PM on 09/19/2014 -----------------------------------------   BP 130/74 mmHg  Pulse 74  Temp(Src) 97.7 F (36.5 C) (Oral)  Resp 18  Ht 6' (1.829 m)  Wt 165 lb (74.844 kg)  BMI 22.37 kg/m2  SpO2 99%  The patient had no acute events since last update.  Calm and cooperative at this time.  Discussed with Dr. Toni Amendlapacs and recommends admission.     Myrna Blazeravid Lyndle Christasia Angeletti, MD 09/19/14 Jerene Bears1920

## 2014-09-19 NOTE — ED Notes (Signed)
Pt is currently taking a shower 

## 2014-09-19 NOTE — ED Provider Notes (Signed)
-----------------------------------------   7:17 AM on 09/19/2014 -----------------------------------------   BP 138/77 mmHg  Pulse 83  Temp(Src) 97.7 F (36.5 C) (Oral)  Ht 6' (1.829 m)  Wt 165 lb (74.844 kg)  BMI 22.37 kg/m2  SpO2 99%  The patient had no acute events since last update.  Calm and cooperative at this time.  Disposition is pending per Psychiatry/Behavioral Medicine team recommendations.    Arnaldo NatalPaul F Sosaia Pittinger, MD 09/19/14 [phone]

## 2014-09-19 NOTE — ED Notes (Addendum)
Pt. transfered to BHU without incident after report from. Placed in room and oriented to unit. Pt. informed that for their safety all care areas are designed for safety and monitored by security cameras at all times; and visiting hours explained to patient. Patient verbalizes understanding, and verbal contract for safety obtained.   
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
## 2014-09-20 ENCOUNTER — Inpatient Hospital Stay
Admission: EM | Admit: 2014-09-20 | Discharge: 2014-09-24 | DRG: 885 | Disposition: A | Discharge status: Home / Self Care | Payer: Medicaid Other | Source: Intra-hospital | Attending: Psychiatry | Admitting: Psychiatry

## 2014-09-20 DIAGNOSIS — F4310 Post-traumatic stress disorder, unspecified: Secondary | ICD-10-CM | POA: Diagnosis present

## 2014-09-20 DIAGNOSIS — F17200 Nicotine dependence, unspecified, uncomplicated: Secondary | ICD-10-CM | POA: Diagnosis present

## 2014-09-20 DIAGNOSIS — G4700 Insomnia, unspecified: Secondary | ICD-10-CM | POA: Diagnosis present

## 2014-09-20 DIAGNOSIS — Z79899 Other long term (current) drug therapy: Secondary | ICD-10-CM | POA: Diagnosis not present

## 2014-09-20 DIAGNOSIS — Z915 Personal history of self-harm: Secondary | ICD-10-CM | POA: Diagnosis not present

## 2014-09-20 DIAGNOSIS — Z590 Homelessness: Secondary | ICD-10-CM

## 2014-09-20 DIAGNOSIS — F17210 Nicotine dependence, cigarettes, uncomplicated: Secondary | ICD-10-CM | POA: Diagnosis present

## 2014-09-20 DIAGNOSIS — F332 Major depressive disorder, recurrent severe without psychotic features: Principal | ICD-10-CM | POA: Diagnosis present

## 2014-09-20 DIAGNOSIS — F1290 Cannabis use, unspecified, uncomplicated: Secondary | ICD-10-CM | POA: Diagnosis present

## 2014-09-20 DIAGNOSIS — M25559 Pain in unspecified hip: Secondary | ICD-10-CM | POA: Diagnosis present

## 2014-09-20 DIAGNOSIS — Z888 Allergy status to other drugs, medicaments and biological substances status: Secondary | ICD-10-CM | POA: Diagnosis not present

## 2014-09-20 DIAGNOSIS — F1220 Cannabis dependence, uncomplicated: Secondary | ICD-10-CM | POA: Diagnosis present

## 2014-09-20 DIAGNOSIS — F602 Antisocial personality disorder: Secondary | ICD-10-CM | POA: Diagnosis present

## 2014-09-20 DIAGNOSIS — R45850 Homicidal ideations: Secondary | ICD-10-CM | POA: Diagnosis present

## 2014-09-20 DIAGNOSIS — F319 Bipolar disorder, unspecified: Secondary | ICD-10-CM | POA: Diagnosis present

## 2014-09-20 DIAGNOSIS — F603 Borderline personality disorder: Secondary | ICD-10-CM | POA: Diagnosis present

## 2014-09-20 DIAGNOSIS — Z880 Allergy status to penicillin: Secondary | ICD-10-CM

## 2014-09-20 DIAGNOSIS — R45851 Suicidal ideations: Secondary | ICD-10-CM | POA: Diagnosis present

## 2014-09-20 MED ORDER — ALUM & MAG HYDROXIDE-SIMETH 200-200-20 MG/5ML PO SUSP: 30.0000 mL | ORAL | Status: DC | PRN

## 2014-09-20 MED ORDER — NICOTINE 21 MG/24HR TD PT24
21.0000 mg | MEDICATED_PATCH | Freq: Every day | TRANSDERMAL | Status: DC
  Filled 2014-09-20 (×4): qty 1

## 2014-09-20 MED ORDER — PRAZOSIN HCL 2 MG PO CAPS
2.0000 mg | ORAL_CAPSULE | Freq: Two times a day (BID) | ORAL | Status: DC
  Administered 2014-09-20: 2 mg via ORAL
  Filled 2014-09-20: qty 1

## 2014-09-20 MED ORDER — HALOPERIDOL 0.5 MG PO TABS
0.5000 mg | ORAL_TABLET | Freq: Three times a day (TID) | ORAL | Status: DC
  Administered 2014-09-22 – 2014-09-24 (×5): 0.5 mg via ORAL
  Filled 2014-09-20 (×10): qty 1

## 2014-09-20 MED ORDER — IBUPROFEN 400 MG PO TABS
400.0000 mg | ORAL_TABLET | Freq: Four times a day (QID) | ORAL | Status: DC | PRN
  Administered 2014-09-20 – 2014-09-23 (×2): 400 mg via ORAL
  Filled 2014-09-20 (×2): qty 1

## 2014-09-20 MED ORDER — MAGNESIUM HYDROXIDE 400 MG/5ML PO SUSP: 30.0000 mL | Freq: Every day | ORAL | Status: DC | PRN

## 2014-09-20 MED ORDER — HYDROXYZINE HCL 50 MG PO TABS
50.0000 mg | ORAL_TABLET | Freq: Four times a day (QID) | ORAL | Status: DC | PRN
  Administered 2014-09-22: 50 mg via ORAL
  Filled 2014-09-20: qty 1

## 2014-09-20 MED ORDER — ACETAMINOPHEN 325 MG PO TABS: 650.0000 mg | ORAL_TABLET | Freq: Four times a day (QID) | ORAL | Status: DC | PRN

## 2014-09-20 MED ORDER — TRAZODONE HCL 100 MG PO TABS
100.0000 mg | ORAL_TABLET | Freq: Every day | ORAL | Status: DC
  Administered 2014-09-23: 100 mg via ORAL
  Filled 2014-09-20 (×4): qty 1

## 2014-09-20 MED ORDER — CARBAMAZEPINE 200 MG PO TABS
200.0000 mg | ORAL_TABLET | Freq: Three times a day (TID) | ORAL | Status: DC
Start: 2014-09-20 — End: 2014-09-20
  Administered 2014-09-20: 200 mg via ORAL
  Filled 2014-09-20: qty 1

## 2014-09-20 MED ORDER — PANTOPRAZOLE SODIUM 40 MG PO TBEC
40.0000 mg | DELAYED_RELEASE_TABLET | Freq: Every day | ORAL | Status: DC
  Administered 2014-09-20 – 2014-09-24 (×5): 40 mg via ORAL
  Filled 2014-09-20 (×5): qty 1

## 2014-09-20 NOTE — ED Notes (Signed)
Pt. Noted in room. No complaints or concerns voiced. No distress or abnormal behavior noted. Will continue to monitor with security cameras. Q 15 minute rounds continue. 

## 2014-09-20 NOTE — Plan of Care (Signed)
Problem: Aggression Towards others,Towards Self, and or Destruction Goal: STG-Patient will identify triggers Outcome: Progressing Verbalizes that his girlfriend is one of his triggers

## 2014-09-20 NOTE — Tx Team (Signed)
Initial Interdisciplinary Treatment Plan   PATIENT STRESSORS: Substance abuse Traumatic event   PATIENT STRENGTHS: Ability for insight Communication skills Physical Health   PROBLEM LIST: Problem List/Patient Goals Date to be addressed Date deferred Reason deferred Estimated date of resolution  Homicidal thoughts 09/19/14     SIB  09/19/14                                                DISCHARGE CRITERIA:  Improved stabilization in mood, thinking, and/or behavior  PRELIMINARY DISCHARGE PLAN: Outpatient therapy  PATIENT/FAMIILY INVOLVEMENT: This treatment plan has been presented to and reviewed with the patient, Rise PaganiniMatthew Rosa, and/or family member.  The patient and family have been given the opportunity to ask questions and make suggestions.  Melisa G Edwina Grossberg 09/20/2014, 5:42 AM

## 2014-09-20 NOTE — [facility] Suicide Risk Assessment (Signed)
[facility]   Nursing information obtained from:    Demographic factors:    Current Mental Status:    Loss Factors:    Historical Factors:    Risk Reduction Factors:    Total Time spent with patient: 1 hour Principal Problem: Major depressive disorder, recurrent episode, severe Diagnosis:   Patient Active Problem List   Diagnosis Date Noted  . Tobacco use disorder [Z72.0] 09/20/2014  . Major depressive disorder, recurrent episode, severe [F33.2] 09/20/2014  . Antisocial personality disorder [F60.2] 09/20/2014  . Borderline personality disorder [F60.3] 09/19/2014  . Post traumatic stress disorder (PTSD) [F43.10] 09/03/2014  . Cannabis use disorder, severe, dependence [F12.20] 08/31/2014     Continued Clinical Symptoms:  Alcohol Use Disorder Identification Test Final Score (AUDIT): 1 The "Alcohol Use Disorders Identification Test", Guidelines for Use in Primary Care, Second Edition.  World Science writerHealth Organization [facility](WHO). Score between 0-7:  no or low risk or alcohol related problems. Score between 8-15:  moderate risk of alcohol related problems. Score between 16-19:  high risk of alcohol related problems. Score 20 or above:  warrants further diagnostic evaluation for alcohol dependence and treatment.   CLINICAL FACTORS:   Depression:   Aggression Comorbid alcohol abuse/dependence Impulsivity Severe Alcohol/Substance Abuse/Dependencies Personality Disorders:   Cluster B Comorbid alcohol abuse/dependence    Psychiatric Specialty Exam: Physical Exam  ROS  Blood pressure 111/74, pulse 72, temperature 98 F (36.7 C), temperature source Oral, resp. rate 20, height 6' (1.829 m), weight 74.844 kg (165 lb).Body mass index is 22.37 kg/(m^2).                                                         COGNITIVE FEATURES THAT CONTRIBUTE TO RISK:  Polarized thinking    SUICIDE RISK:   Moderate:  Frequent suicidal ideation with  limited intensity, and duration, some specificity in terms of plans, no associated intent, good self-control, limited dysphoria/symptomatology, some risk factors present, and identifiable protective factors, including available and accessible social support.  [facility]  Medical Decision Making:  Established Problem, Worsening (2)  I certify that inpatient services furnished can reasonably be expected to improve the patient's condition.   Jimmy FootmanHernandez-Gonzalez,  Damain Broadus 09/20/2014, 12:45 PM

## 2014-09-20 NOTE — Progress Notes (Signed)
Patient has been in the dayroom with staff and peers. Alert and oriented and interacting well with peers, engaging in conversations. Patient refused medications stating that "those do do me anything, I refuse to take them...". Currently in dayroom, watching TV. Had no other concern. Support and encouragements offered and safety maintained.

## 2014-09-20 NOTE — Progress Notes (Signed)
Recreation Therapy Notes  INPATIENT RECREATION THERAPY ASSESSMENT  Patient Details Name: Reginald Bowman MRN: [id number] DOB: 02-28-1986 Today's Date: 09/20/2014  Patient Stressors: Other (Comment) (No home, finances)  Coping Skills:   Isolate, Arguments, Substance Abuse, Avoidance, Self-Injury, Exercise, Talking, Music  Personal Challenges: Anger, Decision-Making, Relationships, Self-Esteem/Confidence, Social Interaction, Stress Management, Substance Abuse, Time Management, Trusting Others  Leisure Interests (2+):  Individual - Other (Comment), Music - Listen (Travel)  Awareness of Community Resources:  Yes  Community Resources:  Park  Current Use: No  If no, Barriers?: Other (Comment) (Been so depressed)  Patient Strengths:  Hair, personality  Patient Identified Areas of Improvement:  Trusting others  Current Recreation Participation:  Nothing  Patient Goal for Hospitalization:  Try to find somehwere to go that he can handle  [place]  [place] Residence:  [place]   Current SI (including self-harm):  No (Right now no, but I've had them all day)  Current HI:  No  Consent to Intern Participation: N/A   Jacquelynn CreeGreene,Roseanne Juenger M, LRT/CTRS 09/20/2014, 2:46 PM

## 2014-09-20 NOTE — Progress Notes (Signed)
Patient stays to himself.  Easily irritated. Medication compliant.

## 2014-09-20 NOTE — [facility] Group Notes (Signed)
[facility] LCSW Group Therapy  09/20/2014 3:22 PM  Type of Therapy:  Group Therapy  Participation Level:  Did Not Attend  Participation Quality:  n/a  Affect:  n/a  Cognitive:  n/a  Insight:  n/a  Engagement in Therapy:  n/a  Modes of Intervention:  n/a   Summary of Progress/Problems: Patient did not attend group.  Beryl MeagerIngle, Ronin Rehfeldt T 09/20/2014, 3:22 PM

## 2014-09-20 NOTE — ED Notes (Signed)
Pt. Noted sleeping in room. No complaints or concerns voiced. No distress or abnormal behavior noted. Will continue to monitor with security cameras. Q 15 minute rounds continue. 

## 2014-09-20 NOTE — Plan of Care (Signed)
Problem: Aggression Towards others,Towards Self, and or Destruction Goal: LTG - No aggression,physical/verbal/destruction prior to D/C (Patient will have no episodes of physical or verbal aggression or property destruction towards self or others for _____ day (s) prior to discharge.) Outcome: Progressing No aggression upon admission to unit Goal: STG-Patient will identify triggers Outcome: Progressing Identifies his girlfriend as a trigger. Also identifies the bank as a trigger as his disability check is being held. He notes his plans to harm property and people if his check is not released. Goal: STG-Patient will identify a plan to deal with triggers Outcome: Not Progressing No alternative plan noted to the aggression

## 2014-09-20 NOTE — Outcomes Assessment (Signed)
Patient's skin assessed upon admission. No contraband noted upon inspection.

## 2014-09-20 NOTE — [facility] Group Notes (Signed)
 [facility] Planning Group Note  09/20/2014 3:01 PM  Participation Quality:  did not attend  Affect:  n/a  Cognitive:  n/a  Insight:  n/a  Engagement in Group:  n/a  Modes of Intervention:  n/a  Summary of Progress/Problems: Patient did not attend group.  Beryl MeagerIngle, Jouri Threat T 09/20/2014, 3:01 PM
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
## 2014-09-20 NOTE — [facility] Group Notes (Signed)
[facility] Group Notes:  (Nursing/MHT/Case Management/Adjunct)  Date:  09/20/2014  Time:  12:37 PM  Type of Therapy:  Psychoeducational Skills  Participation Level:  Active  Participation Quality:  Appropriate  Affect:  Appropriate  Cognitive:  Appropriate  Insight:  Appropriate  Engagement in Group:  Engaged  Modes of Intervention:  Activity  Summary of Progress/Problems:  Karen ChafeJonathan Mark Knox Cervi 09/20/2014, 12:37 PM

## 2014-09-20 NOTE — Progress Notes (Signed)
See belongings sheet in chart

## 2014-09-20 NOTE — Progress Notes (Signed)
Recreation Therapy Notes  Date: 05.27.16 Time: 3:00 pm Location: Craft Room  Group Topic: Communication, Problem solving, Teamwork  Goal Area(s) Addresses:  Patient will work in teams towards shared goal. Patient will verbalize skills needed to make activity successful. Patient will verbalize benefit of using skills identify to reach post d/c goals.  Behavioral Response: Did not attend   Intervention: Landing Pad  Activity: Patients were given 12 straws and approximately 2.5 feet of tape and instructed to build a lading pad to catch a golf ball from 4 feet.   Education: LRT educated patient on communication, problem solving, and teamwork and why these skills are important.   Education Outcome: Patient did not attend group.  Clinical Observations/Feedback: Patient did not attend group.   Jacquelynn CreeGreene,Arnell Slivinski M, LRT/CTRS 09/20/2014 4:31 PM

## 2014-09-21 MED ORDER — CARBAMAZEPINE ER 200 MG PO TB12
200.0000 mg | ORAL_TABLET | Freq: Every day | ORAL | Status: DC
  Administered 2014-09-21 – 2014-09-23 (×3): 200 mg via ORAL
  Filled 2014-09-21 (×7): qty 1

## 2014-09-21 MED ORDER — CARBAMAZEPINE 100 MG PO CHEW: 200.0000 mg | CHEWABLE_TABLET | Freq: Every day | ORAL | Status: DC

## 2014-09-21 NOTE — Progress Notes (Signed)
Nursing care taken over at 0015 patient resting in bed at that time with eyes closed , no issues to report on shift thus far.  

## 2014-09-21 NOTE — Progress Notes (Signed)
Patient has been alert and oriented today. Affect has been flat and appears anxious. Patient has poor eye contact.. He has been selectively medication and group compliant. Denies suicidal or homicidal thoughts. Denies auditory or visual hallucinations. Q 15 min checks in place. Will cont to monitor for safety.  
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
## 2014-09-22 DIAGNOSIS — F319 Bipolar disorder, unspecified: Secondary | ICD-10-CM | POA: Diagnosis present

## 2014-09-22 NOTE — Progress Notes (Signed)
Patient continues to refuse his PO medications; rates his depression a "3"; denies any SI and contracts for safety; has mild anxiety at times; denies any hallucinations; continue to monitor.

## 2014-09-22 NOTE — Plan of Care (Signed)
Problem: Aggression Towards others,Towards Self, and or Destruction Goal: STG-Patient will comply with prescribed medication regimen (Patient will comply with prescribed medication regimen)  Outcome: Not Progressing Patient continues to refuse to take his prescribed medications.
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
## 2014-09-22 NOTE — [facility] Group Notes (Signed)
[facility] Group Notes:  (Nursing/MHT/Case Management/Adjunct)  Date:  09/21/2014  Time:  2030  Type of Therapy:  Group Therapy  Participation Level:  Did Not Attend  Participation Quality:  Did not attend  Affect:  Did not attend  Cognitive:  Did not attend  Insight:  None  Engagement in Group:  Did not attend  Modes of Intervention:  Did not attend  Summary of Progress/Problems:  Rodolphe Edmonston L Emmogene Simson 09/22/2014, 4:43 AM

## 2014-09-22 NOTE — [facility] Group Notes (Signed)
[facility] LCSW Group Therapy  09/22/2014 7:58 AM  Type of Therapy:  Group Therapy  Participation Level:  Active  Participation Quality:  Attentive  Affect:  Appropriate  Cognitive:  Appropriate  Insight:  Distracting  Engagement in Therapy:  Distracting  Modes of Intervention:  Discussion, Education, Exploration and Support  Summary of Progress/Problems:Suicide prevention and intervention was discussed. Patient were provided the suicide prevention and intervention handout. Pt was expressive and relayed that he listens to an occult band that sing songs about suicide and the dark stuff. He informed his peers he listens because he feels like they are singing his life story. He disclosed he is suicidal and struggles each day to live. Patient was supported after group by LCSW and did contract for safety.  Arrie SenateBandi, Newell Wafer M 09/22/2014, 7:58 AM

## 2014-09-22 NOTE — Progress Notes (Signed)
Pt has been pleasant and cooperative. Pts mood and affect has been depressed. Pt has been seclusive to his room most of the day. Pt denies SI and A/V hallucinations. Pt has taken all po meds this period without coaxing. No inappropriate behaviors noted this period.

## 2014-09-23 NOTE — Plan of Care (Signed)
Problem: Aggression Towards others,Towards Self, and or Destruction Goal: STG-Patient will comply with prescribed medication regimen (Patient will comply with prescribed medication regimen)  Outcome: Progressing Med compliant.

## 2014-09-23 NOTE — Progress Notes (Signed)
Patient has been alert and oriented today. Affect has been flat and appears anxious. Patient has poor eye contact.. He has been selectively medication and group compliant. Denies suicidal or homicidal thoughts. Denies auditory or visual hallucinations. Q 15 min checks in place. Will cont to monitor for safety.

## 2014-09-23 NOTE — Progress Notes (Signed)
Eulon isolates to room.Has eyes covered with part of scrubs cloth.Sad,flat and depressed.Med compliant.States he has back and dental pain.Would like to get percocet or oxycodone.States he had a dilaudid drip when he was at the last hospital.Then started carry on about pain clinics and unable to get meds for pain.No interaction with peers.Minimal eye contact.Will continue to observe.

## 2014-09-23 NOTE — Plan of Care (Signed)
Problem: [facility] Participation in Recreation Therapeutic Interventions Goal: STG-Patient will demonstrate improved self esteem by identif STG: Self-Esteem - Within 5 treatment sessions, patient will verbalize at least 5 positive affirmation statements in each of 3 treatment sessions to increase self-esteem post d/c.  Outcome: Progressing Treatment Session 1; Completed 1 out of 3: At approximately 11:20 am, LRT met with patient in patient room. Patient verbalized 5 positive affirmation statements. Patient reported he "didn't feel anything". LRT encouraged patient to continue saying positive affirmation statements.  Leonette Monarch, LRT/CTRS 05.30.16 12:30 pm
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
## 2014-09-23 NOTE — [facility] Group Notes (Signed)
[facility] Group Notes:  (Nursing/MHT/Case Management/Adjunct)  Date:  09/23/2014  Time:  1:41 PM  Type of Therapy:  Psychoeducational Skills  Participation Level:  Did Not Attend  Participation Quality:  n/a  Affect:  n/a  Cognitive:  n/a  Insight:  None  Engagement in Group:  n/a  Modes of Intervention:  n/a  Summary of Progress/Problems:  Lynelle SmokeCara Travis Amaia Lavallie 09/23/2014, 1:41 PM

## 2014-09-23 NOTE — Progress Notes (Signed)
Recreation Therapy Notes  Date: 05.30.16 Time: 3:05 pm Location: Craft Room  Group Topic: Self-expression   Goal Area(s) Addresses:  Patient will draw a bottle. Patient will write at least one emotion they are experiencing.  Behavioral Response: Reserved/Withdrawn, Left early  Intervention: Bottled Up  Activity: Patients were instructed to draw a bottled of how they feel and write emotions they are feeling inside the bottle.   Education:LRT educated patients on different forms of self-expression.   Education Outcome: Acknowledges education/In group clarification offered  Clinical Observations/Feedback: Patient sat against the wall and wrote during group. Patient left group at approximately 3:32 pm. Patient did not return to group.  Jacquelynn CreeGreene,Jahn Franchini M, LRT/CTRS 09/23/2014 4:26 PM

## 2014-09-23 NOTE — Plan of Care (Signed)
Problem: Aggression Towards others,Towards Self, and or Destruction Goal: STG-Patient will comply with prescribed medication regimen (Patient will comply with prescribed medication regimen)  Outcome: Not Progressing Patient continues to refuse his PO medications; states "I'm not taking them when I leave, so why start now."

## 2014-09-23 NOTE — Progress Notes (Signed)
Patient rates his depression a "5"; continues to have fleeting thoughts of suicide, but denies having a plan, and contracts for safety; continues to refuse his scheduled medications, saying "I'm not going to take any medicines when I leave here, so why do it now?" Patient has been calm and pleasant throughout the shift; continue to monitor.

## 2014-09-24 MED ORDER — CARBAMAZEPINE ER 200 MG PO CP12
200.0000 mg | ORAL_CAPSULE | Freq: Every day | ORAL | Status: DC
  Administered 2014-09-24: 200 mg via ORAL
  Filled 2014-09-24: qty 1

## 2014-09-24 MED ORDER — MELOXICAM 7.5 MG PO TABS: 15.0000 mg | ORAL_TABLET | Freq: Once | ORAL | Status: DC

## 2014-09-24 MED ORDER — HALOPERIDOL 0.5 MG PO TABS: 0.5000 mg | ORAL_TABLET | Freq: Three times a day (TID) | ORAL | Status: DC | PRN

## 2014-09-24 MED ORDER — NICOTINE 21 MG/24HR TD PT24: 21.0000 mg | MEDICATED_PATCH | Freq: Every day | TRANSDERMAL | Status: DC

## 2014-09-24 NOTE — [facility] Suicide Risk Assessment (Signed)
[facility]   Demographic Factors:  Male, Low socioeconomic status and Unemployed  Total Time spent with patient: 30 minutes   Psychiatric Specialty Exam: Physical Exam  ROS  Blood pressure 125/80, pulse 56, temperature 98 F (36.7 C), temperature source Oral, resp. rate 20, height 6' (1.829 m), weight 74.844 kg (165 lb).Body mass index is 22.37 kg/(m^2).                                                       Have you used any form of tobacco in the last 30 days? (Cigarettes, Smokeless Tobacco, Cigars, and/or Pipes): Yes  Has this patient used any form of tobacco in the last 30 days? (Cigarettes, Smokeless Tobacco, Cigars, and/or Pipes) Yes, A prescription for an FDA-approved tobacco cessation medication was offered at discharge and the patient refused  Mental Status Per Nursing Assessment::   On Admission:     Current Mental Status by Physician: Denies suicidality, homicidality or auditory visual hallucinations. No evidence of mania or hypomania.  Loss Factors: Loss of significant relationship, Decline in physical health, Legal issues and Financial problems/change in socioeconomic status  Historical Factors: Prior suicide attempts and Family history of mental illness or substance abuse  Risk Reduction Factors:   Receives disability  Continued Clinical Symptoms:  Alcohol/Substance Abuse/Dependencies Personality Disorders:   Cluster B Comorbid alcohol abuse/dependence  Cognitive Features That Contribute To Risk:  None    Suicide Risk:  Minimal: No identifiable suicidal ideation.  Patients presenting with no risk factors but with morbid ruminations; may be classified as minimal risk based on the severity of the depressive symptoms  Principal Problem: Major depressive disorder, recurrent episode, severe Discharge Diagnoses:  Patient Active Problem List   Diagnosis Date Noted  . Major depressive disorder, recurrent, severe  without psychotic features [F33.2]   . Tobacco use disorder [Z72.0] 09/20/2014  . Major depressive disorder, recurrent episode, severe [F33.2] 09/20/2014  . Antisocial personality disorder [F60.2] 09/20/2014  . Borderline personality disorder [F60.3] 09/19/2014  . Post traumatic stress disorder (PTSD) [F43.10] 09/03/2014  . Cannabis use disorder, severe, dependence [F12.20] 08/31/2014      Plan Of Care/Follow-up recommendations:  Other:  Follow-up with outpatient psychiatry  Is patient on multiple antipsychotic therapies at discharge:  No   Has Patient had three or more failed trials of antipsychotic monotherapy by history:  No  Recommended Plan for Multiple Antipsychotic Therapies: NA    Jimmy FootmanHernandez-Gonzalez,  Danniell Rotundo 09/24/2014, 9:51 AM

## 2014-09-24 NOTE — Plan of Care (Signed)
Problem: [facility] Participation in Recreation Therapeutic Interventions Goal: STG-Patient will demonstrate improved self esteem by identif STG: Self-Esteem - Within 5 treatment sessions, patient will verbalize at least 5 positive affirmation statements in each of 3 treatment sessions to increase self-esteem post d/c.  Outcome: Adequate for Discharge Treatment Session 2; Completed 2 out of 3: At approximately 10:05 am, LRT met with patient in patient room. Patient verbalized 5 positive affirmation statements. Patient reported he "doesn't feel anything", but that it was a little bit better. LRT encouraged patient to continue saying positive affirmation statements.  Leonette Monarch, LRT/CTRS 05.31.16 11:29 am Goal: STG-Patient will demonstrate improved communication skills b STG: Communication - Within 3 treatment sessions, patient will use healthy communication skills in one treatment session to increase trust post d/c.  Outcome: Completed/Met Date Met:  09/24/14 Treatment Session 1; Completed 1 out of 1: At approximately 9:55 am, LRT met with patient in craft room. Patient was instructed to take as much toilet paper as he would use in 3 hours. For each square he pulled off, he was instructed to list fun facts. Patient able to give fun facts for each square he pulled off. LRT educated patient on communication and trust. Patient reported treatment session was "a little helpful".   Leonette Monarch, LRT/CTRS 05.31.16 11:31 am

## 2014-09-24 NOTE — Progress Notes (Signed)
[facility]
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
## 2014-09-24 NOTE — Progress Notes (Signed)
Patient labile.  Denies SI and depression.  Verbalizes that he has a lot that he is dealing and it does not help that he has to walk 1-2 miles a day. Discharge instructions given, verbalized understanding.  Personal belongings returned.  Escorted to main entrance to travel home.

## 2014-09-24 NOTE — Progress Notes (Signed)
Recreation Therapy Notes  INPATIENT RECREATION TR PLAN  Patient Details Name: Reginald Bowman MRN: [id number] DOB: 01-01-1986 Today's Date: 09/24/2014  Rec Therapy Plan Is patient appropriate for Therapeutic Recreation?: Yes Treatment times per week: At least 3 times a week TR Treatment/Interventions: 1:1 session, Group participation (Comment) (Appropriate participation in daily recreation therapy tx)  Discharge Criteria Pt will be discharged from therapy if:: Discharged Treatment plan/goals/alternatives discussed and agreed upon by:: Patient/family  Discharge Summary Short term goals set: See Care Plan Short term goals met: Complete, Adequate for discharge Progress toward goals comments: One-to-one attended Which groups?: Other (Comment) (Self-expression) One-to-one attended: Self-esteem, trust Reason goals not met: Patient planning to d/c before goal could be met. Therapeutic equipment acquired: None Reason patient discharged from therapy: Discharge from hospital Pt/family agrees with progress & goals achieved: Yes Date patient discharged from therapy: 09/24/14   Leonette Monarch, LRT/CTRS 09/24/2014, 11:33 AM

## 2014-09-24 NOTE — [facility] Group Notes (Signed)
[facility] Group Notes:  (Nursing/MHT/Case Management/Adjunct)  Date:  09/24/2014  Time:  1:57 PM  Type of Therapy:  Group Therapy  Participation Level:  Did Not Attend   Reginald Bowman 09/24/2014, 1:57 PM 

## 2014-09-27 ENCOUNTER — Emergency Department
Admission: EM | Admit: 2014-09-27 | Discharge: 2014-09-30 | Disposition: A | Discharge status: Home / Self Care | Payer: Medicaid Other | Attending: Emergency Medicine | Admitting: Emergency Medicine

## 2014-09-27 DIAGNOSIS — Z79899 Other long term (current) drug therapy: Secondary | ICD-10-CM | POA: Diagnosis not present

## 2014-09-27 DIAGNOSIS — G8929 Other chronic pain: Secondary | ICD-10-CM | POA: Diagnosis not present

## 2014-09-27 DIAGNOSIS — Z880 Allergy status to penicillin: Secondary | ICD-10-CM | POA: Diagnosis not present

## 2014-09-27 DIAGNOSIS — F603 Borderline personality disorder: Secondary | ICD-10-CM | POA: Insufficient documentation

## 2014-09-27 DIAGNOSIS — Z720 Tobacco use: Secondary | ICD-10-CM | POA: Diagnosis not present

## 2014-09-27 DIAGNOSIS — M549 Dorsalgia, unspecified: Secondary | ICD-10-CM | POA: Insufficient documentation

## 2014-09-27 DIAGNOSIS — Z046 Encounter for general psychiatric examination, requested by authority: Secondary | ICD-10-CM | POA: Diagnosis present

## 2014-09-27 LAB — URINE DRUG SCREEN, QUALITATIVE (ARMC ONLY)
AMPHETAMINES, UR SCREEN: NOT DETECTED
BARBITURATES, UR SCREEN: NOT DETECTED
Benzodiazepine, Ur Scrn: NOT DETECTED
CANNABINOID 50 NG, UR ~~LOC~~: POSITIVE — AB
COCAINE METABOLITE, UR ~~LOC~~: NOT DETECTED
MDMA (Ecstasy)Ur Screen: NOT DETECTED
Methadone Scn, Ur: NOT DETECTED
OPIATE, UR SCREEN: NOT DETECTED
Phencyclidine (PCP) Ur S: NOT DETECTED
Tricyclic, Ur Screen: NOT DETECTED

## 2014-09-27 LAB — URINALYSIS COMPLETE WITH MICROSCOPIC (ARMC ONLY)
Bilirubin Urine: NEGATIVE
GLUCOSE, UA: NEGATIVE mg/dL
Hgb urine dipstick: NEGATIVE
Leukocytes, UA: NEGATIVE
NITRITE: NEGATIVE
PH: 5 (ref 5.0–8.0)
Protein, ur: 100 mg/dL — AB
Specific Gravity, Urine: 1.033 — ABNORMAL HIGH (ref 1.005–1.030)

## 2014-09-27 LAB — ACETAMINOPHEN LEVEL: Acetaminophen (Tylenol), Serum: <10 ug/mL — ABNORMAL LOW (ref 10–30)

## 2014-09-27 LAB — ETHANOL: ALCOHOL ETHYL (B): 7 mg/dL — AB (ref <5)

## 2014-09-27 LAB — CBC
HCT: 40.2 % (ref 40.0–52.0)
HEMOGLOBIN: 13.6 g/dL (ref 13.0–18.0)
MCH: 30.6 pg (ref 26.0–34.0)
MCHC: 33.9 g/dL (ref 32.0–36.0)
MCV: 90.3 fL (ref 80.0–100.0)
Platelets: 181 10*3/uL (ref 150–440)
RBC: 4.45 MIL/uL (ref 4.40–5.90)
RDW: 13.3 % (ref 11.5–14.5)
WBC: 8.1 10*3/uL (ref 3.8–10.6)

## 2014-09-27 LAB — COMPREHENSIVE METABOLIC PANEL
ALBUMIN: 4.1 g/dL (ref 3.5–5.0)
ALK PHOS: 73 U/L (ref 38–126)
ALT: 13 U/L — ABNORMAL LOW (ref 17–63)
AST: 18 U/L (ref 15–41)
Anion gap: 7 (ref 5–15)
BILIRUBIN TOTAL: 0.3 mg/dL (ref 0.3–1.2)
BUN: 14 mg/dL (ref 6–20)
CO2: 26 mmol/L (ref 22–32)
Calcium: 8.8 mg/dL — ABNORMAL LOW (ref 8.9–10.3)
Chloride: 108 mmol/L (ref 101–111)
Creatinine, Ser: 1.17 mg/dL (ref 0.61–1.24)
GFR calc Af Amer: >60 mL/min (ref >60)
GFR calc non Af Amer: >60 mL/min (ref >60)
GLUCOSE: 138 mg/dL — AB (ref 65–99)
POTASSIUM: 3.8 mmol/L (ref 3.5–5.1)
SODIUM: 141 mmol/L (ref 135–145)
Total Protein: 6.9 g/dL (ref 6.5–8.1)

## 2014-09-27 LAB — SALICYLATE LEVEL: Salicylate Lvl: <4 mg/dL (ref 2.8–30.0)
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
## 2014-09-28 MED ORDER — ACETAMINOPHEN 325 MG PO TABS
650.0000 mg | ORAL_TABLET | Freq: Four times a day (QID) | ORAL | Status: DC | PRN
Start: 2014-09-28 — End: 2014-09-30

## 2014-09-28 MED ORDER — ALUM & MAG HYDROXIDE-SIMETH 200-200-20 MG/5ML PO SUSP: 30.0000 mL | ORAL | Status: DC | PRN

## 2014-09-28 MED ORDER — MAGNESIUM HYDROXIDE 400 MG/5ML PO SUSP
30.0000 mL | Freq: Every day | ORAL | Status: DC | PRN
Start: 2014-09-28 — End: 2014-09-30

## 2014-09-28 NOTE — ED Notes (Signed)
Report received from Noel RN, pt. transfered to [facility] without incident after report from. Placed in room and oriented to unit. Pt. informed that for their safety all care areas are designed for safety and monitored by security cameras at all times; and visiting hours explained to patient. Patient verbalizes understanding, and verbal contract for safety obtained.   

## 2014-09-28 NOTE — ED Provider Notes (Signed)
-----------------------------------------   7:11 AM on 09/28/2014 -----------------------------------------   BP 124/63 mmHg  Pulse 76  Temp(Src) 97.8 F (36.6 C) (Oral)  Resp 18  Ht 6' (1.829 m)  Wt 170 lb (77.111 kg)  BMI 23.05 kg/m2  SpO2 97%  The patient had no acute events since last update.  Calm and cooperative at this time.  Disposition is pending per Psychiatry/Behavioral Medicine team recommendations.     Sharman CheekPhillip Lamerle Jabs, MD 09/28/14 [phone]

## 2014-09-28 NOTE — BH Assessment (Signed)
Assessment Note  Reginald Bowman is an 29 y.o. male, presents to the ED stating, "I want to hurt my ex-girlfriend; I might as well; I got nothing else; I'm going to kill her; to stab her in the throat; we argued about her cheating on me yesterday; I saw her sitting on the porch hugging a crack cocaine dealer; I think about her and this stuff all of the time; I had to come here."    Axis I: Adjustment Disorder with Depressed Mood, Bipolar, mixed and Post Traumatic Stress Disorder Axis II: Deferred Axis III:  Past Medical History  Diagnosis Date  . Depression   . Bipolar disorder   . Chronic back pain    Axis IV: economic problems, housing problems, other psychosocial or environmental problems, problems with access to health care services and problems with primary support group Axis V: 11-20 some danger of hurting self or others possible OR occasionally fails to maintain minimal personal hygiene OR gross impairment in communication  Past Medical History:  Past Medical History  Diagnosis Date  . Depression   . Bipolar disorder   . Chronic back pain     No past surgical history on file.  Family History: No family history on file.  Social History:  reports that he has been smoking Cigarettes.  He does not have any smokeless tobacco history on file. He reports that he uses illicit drugs (Marijuana). He reports that he does not drink alcohol.  Additional Social History:     CIWA: CIWA-Ar BP: 124/63 mmHg Pulse Rate: 76 COWS:    Allergies:  Allergies  Allergen Reactions  . Shellfish Allergy Nausea And Vomiting  . Penicillins Nausea And Vomiting  . Risperidone And Related Anxiety    Home Medications:  (Not in a hospital admission)  OB/GYN Status:  No LMP for male patient.  General Assessment Data Location of Assessment: [facility] ED TTS Assessment: In system Is this a Tele or Face-to-Face Assessment?: Face-to-Face Is this an Initial Assessment or a Re-assessment for this  encounter?: Re-Assessment Marital status: Single Maiden name: none Is patient pregnant?: No Pregnancy Status: No Living Arrangements: Alone Can pt return to current living arrangement?: Yes Admission Status: Voluntary Is patient capable of signing voluntary admission?: Yes Referral Source: Self/Family/Friend Insurance type: [employer] Medicaid  Medical Screening Exam [facility] Walk-in ONLY) Medical Exam completed: Yes  Crisis Care Plan Living Arrangements: Alone Name of Psychiatrist: n/a Name of Therapist: n/a  Education Status Is patient currently in school?: No Current Grade: n/a Highest grade of school patient has completed: GED and some college Name of school: n/a Contact person: n/a  Risk to self with the past 6 months Suicidal Ideation: No Has patient been a risk to self within the past 6 months prior to admission? : Yes Suicidal Intent: No Has patient had any suicidal intent within the past 6 months prior to admission? : Yes Is patient at risk for suicide?: No Suicidal Plan?: No Has patient had any suicidal plan within the past 6 months prior to admission? : No Specify Current Suicidal Plan: none Access to Means: No Specify Access to Suicidal Means: none What has been your use of drugs/alcohol within the last 12 months?: cannibis Previous Attempts/Gestures: No How many times?: 0 Other Self Harm Risks: 0 Triggers for Past Attempts: Other personal contacts (ex-girlfriend) Intentional Self Injurious Behavior: None Comment - Self Injurious Behavior:  (none) Family Suicide History: Unknown Recent stressful life event(s): Conflict (Comment) (ex-girlfriend) Persecutory voices/beliefs?: No Depression: Yes Depression Symptoms:  Feeling angry/irritable, Loss of interest in usual pleasures Substance abuse history and/or treatment for substance abuse?: Yes Suicide prevention information given to non-admitted patients: Yes  Risk to Others within the past 6 months Homicidal Ideation:  Yes-Currently Present Does patient have any lifetime risk of violence toward others beyond the six months prior to admission? : Yes (comment) Thoughts of Harm to Others: Yes-Currently Present Comment - Thoughts of Harm to Others: "I wanted to kill my ex-girlfriend." Current Homicidal Intent: No-Not Currently/Within Last 6 Months Current Homicidal Plan: No Access to Homicidal Means: No Identified Victim: ex-girlfriend History of harm to others?: No Assessment of Violence: On admission Violent Behavior Description: none Does patient have access to weapons?: No Criminal Charges Pending?: No Does patient have a court date: No Is patient on probation?: No  Psychosis Hallucinations: None noted Delusions: None noted  Mental Status Report Appearance/Hygiene: Poor hygiene, Body odor, In scrubs Eye Contact: Fair Motor Activity: Restlessness Speech: Logical/coherent Level of Consciousness: Alert, Restless Mood: Anxious Affect: Anxious Anxiety Level: Moderate Thought Processes: Coherent, Circumstantial Judgement: Partial Orientation: Person, Place, Time, Situation, Appropriate for developmental age Obsessive Compulsive Thoughts/Behaviors: Moderate  Cognitive Functioning Concentration: Fair Memory: Remote Intact, Recent Intact IQ: Average Insight: Poor Impulse Control: Fair Appetite: Fair Weight Loss: 0 Weight Gain: 0 Sleep: Decreased Total Hours of Sleep: 2 Vegetative Symptoms: None  ADLScreening [facility] Assessment Services) Patient's cognitive ability adequate to safely complete daily activities?: Yes Patient able to express need for assistance with ADLs?: Yes Independently performs ADLs?: Yes (appropriate for developmental age)  Prior Inpatient Therapy Prior Inpatient Therapy: Yes Prior Therapy Dates: 05 '2016 [facility] Reason for Treatment: SI  Prior Outpatient Therapy Prior Outpatient Therapy: No Prior Therapy Dates: n/a Prior Therapy  Facilty/Provider(s): n/a Reason for Treatment: n/a Does patient have an ACCT team?: No Does patient have Intensive In-House Services?  : No Does patient have [facility] services? : No Does patient have P4CC services?: No  ADL Screening (condition at time of admission) Patient's cognitive ability adequate to safely complete daily activities?: Yes Patient able to express need for assistance with ADLs?: Yes Independently performs ADLs?: Yes (appropriate for developmental age)       Abuse/Neglect Assessment (Assessment to be complete while patient is alone) Physical Abuse: Denies Verbal Abuse: Denies Sexual Abuse: Denies Exploitation of patient/patient's resources: Denies Self-Neglect: Denies Values / Beliefs Cultural Requests During Hospitalization: None Spiritual Requests During Hospitalization: None Consults Spiritual Care Consult Needed: No Social Work Consult Needed: No Merchant navy officerAdvance Directives (For Healthcare) Does patient have an advance directive?: No Would patient like information on creating an advanced directive?: No - patient declined information    Additional Information 1:1 In Past 12 Months?: No CIRT Risk: No Elopement Risk: No Does patient have medical clearance?: Yes  Child/Adolescent Assessment Running Away Risk: Denies Bed-Wetting: Denies Destruction of Property: Denies Cruelty to Animals: Denies Stealing: Denies Rebellious/Defies Authority: Denies Satanic Involvement: Denies Archivistire Setting: Denies Problems at [employer]: Denies Gang Involvement: Denies  Disposition:  Disposition Initial Assessment Completed for this Encounter: Yes Disposition of Patient: Other dispositions Other disposition(s): Other (Comment) (Psych MD to see)  On Site Evaluation by:   Reviewed with Physician:    Dwan BoltMargaret Haaris Metallo 09/28/2014 2:05 AM

## 2014-09-28 NOTE — ED Notes (Signed)
BEHAVIORAL HEALTH ROUNDING Patient sleeping: Yes.   Patient alert and oriented: not applicable Behavior appropriate: Yes.  ; If no, describe:   Nutrition and fluids offered: No Toileting and hygiene offered: No Sitter present: no Law enforcement present: Yes  and ODS    

## 2014-09-28 NOTE — Consult Note (Signed)
  [facility]. Pt is a 29 yr old white male who is not employed and is divorced and having problems with girl friend of few yrs and calls himself " no place to stay." H/O Present Illness Pt has been to ER many times and has manipulative behavior and attention seeking. behavior. Alcohol and Drugs. Admit using THC when he could. Please refer to Dr. Rometta Emerylaypac's note on 09/19/2014 and was inpt and was discharged on 09/24/2014  [facility] which did not do as recommended  M.S. Alert and ox3. Calm but very manipulative. No psychosis. Admits that he has homicidal ideas to wards his ex-girl friend who was cheating on him. Very manipulative and attention seeking . No insight into behavior. Does not appear to have any des or plans to hurt himself or others. I/J guarded. Imp Axis 11 Principal Dx Borderline Personality disorder. REc Inpt to Psychiatry when bed is available.

## 2014-09-28 NOTE — ED Notes (Signed)
BEHAVIORAL HEALTH ROUNDING Patient sleeping: Yes.   Patient alert and oriented: No Behavior appropriate: Yes.  ; If no, describe:  Nutrition and fluids offered: No Toileting and hygiene offered: No Sitter present: not applicable Law enforcement present: Yes

## 2014-09-28 NOTE — ED Notes (Signed)

## 2014-09-28 NOTE — ED Notes (Signed)

## 2014-09-28 NOTE — ED Notes (Signed)
Pt laying in bed.  

## 2014-09-28 NOTE — ED Notes (Signed)

## 2014-09-28 NOTE — ED Notes (Signed)
BEHAVIORAL HEALTH ROUNDING Patient sleeping: Yes.   Patient alert and oriented: not applicable Behavior appropriate: Yes.  ; If no, describe:  Nutrition and fluids offered: No Toileting and hygiene offered: No Sitter present: not applicable Law enforcement present: Yes  

## 2014-09-28 NOTE — ED Notes (Signed)

## 2014-09-28 NOTE — ED Notes (Signed)
BEHAVIORAL HEALTH ROUNDING Patient sleeping: No. Patient alert and oriented: yes Behavior appropriate: Yes.  ; If no, describe:  Nutrition and fluids offered: No Toileting and hygiene offered: Yes  Sitter present: not applicable Law enforcement present: Yes  

## 2014-09-28 NOTE — ED Notes (Addendum)
BEHAVIORAL HEALTH ROUNDING Patient sleeping: Yes.   Patient alert and oriented: not applicable Behavior appropriate: Yes.  ; If no, describe:   Nutrition and fluids offered: No Toileting and hygiene offered: No Sitter present: no Law enforcement present: Yes  and ODS    

## 2014-09-28 NOTE — ED Notes (Signed)
BEHAVIORAL HEALTH ROUNDING Patient sleeping: Yes.   Patient alert and oriented: not applicable Behavior appropriate: Yes.  ; If no, describe:   Nutrition and fluids offered: No Toileting and hygiene offered: No Sitter present: no Law enforcement present: Yes  and ODS  ENVIRONMENTAL ASSESSMENT Potentially harmful objects out of patient reach: Yes.   Personal belongings secured: Yes.   Patient dressed in hospital provided attire only: Yes.   Plastic bags out of patient reach: Yes.   Patient care equipment (cords, cables, call bells, lines, and drains) shortened, removed, or accounted for: Yes.   Equipment and supplies removed from bottom of stretcher: Yes.   Potentially toxic materials out of patient reach: Yes.   Sharps container removed or out of patient reach: Yes.     ED BHU PLACEMENT JUSTIFICATION Is the patient under IVC or is there intent for IVC: Yes.   Is the patient medically cleared: Yes.   Is there vacancy in the ED BHU: Yes.   Is the population mix appropriate for patient: Yes.   Is the patient awaiting placement in inpatient or outpatient setting: Yes.   Has the patient had a psychiatric consult: Yes.   Survey of unit performed for contraband, proper placement and condition of furniture, tampering with fixtures in bathroom, shower, and each patient room: Yes.  ; Findings:   APPEARANCE/BEHAVIOR calm and cooperative NEURO ASSESSMENT Orientation: time, place and person Hallucinations: No.None noted (Hallucinations) Speech: Normal Gait: normal RESPIRATORY ASSESSMENT Normal expansion.  Clear to auscultation.  No rales, rhonchi, or wheezing. CARDIOVASCULAR ASSESSMENT regular rate and rhythm, S1, S2 normal, no murmur, click, rub or gallop GASTROINTESTINAL ASSESSMENT soft, nontender, BS WNL, no r/g EXTREMITIES normal strength, tone, and muscle mass PLAN OF CARE Provide calm/safe environment. Vital signs assessed twice daily. ED BHU Assessment once each 12-hour shift.  Collaborate with intake RN daily or as condition indicates. Assure the ED provider has rounded once each shift. Provide and encourage hygiene. Provide redirection as needed. Assess for escalating behavior; address immediately and inform ED provider.  Assess family dynamic and appropriateness for visitation as needed: No.; If necessary, describe findings:   Educate the patient/family about BHU procedures/visitation: Yes.  ; If necessary, describe findings:

## 2014-09-28 NOTE — ED Notes (Signed)
RN with MD and 3 SW's on rounds. 

## 2014-09-28 NOTE — ED Notes (Signed)
Pt calm and cooperative at this time with no complaints, will continue to monitor.

## 2014-09-28 NOTE — ED Notes (Signed)
BEHAVIORAL HEALTH ROUNDING Patient sleeping: No. Patient alert and oriented: yes Behavior appropriate: Yes.  ; If no, describe:   Nutrition and fluids offered: Yes  Toileting and hygiene offered: Yes  Sitter present: no Law enforcement present: Yes  and ODS  

## 2014-09-28 NOTE — ED Notes (Signed)
BEHAVIORAL HEALTH ROUNDING Patient sleeping: No. Patient alert and oriented: yes Behavior appropriate: Yes.  ; If no, describe:  Nutrition and fluids offered: Yes  Toileting and hygiene offered: Yes  Sitter present: not applicable Law enforcement present: Yes ODS/shift 

## 2014-09-28 NOTE — ED Notes (Signed)
BEHAVIORAL HEALTH ROUNDING Patient sleeping: No. Patient alert and oriented: yes Behavior appropriate: Yes.  ; If no, describe:  Nutrition and fluids offered: Yes  Toileting and hygiene offered: Yes  Sitter present: not applicable Law enforcement present: Yes  

## 2014-09-29 MED ORDER — LORAZEPAM 1 MG PO TABS
ORAL_TABLET | ORAL | Status: AC
  Administered 2014-09-29: 1 mg via ORAL
  Filled 2014-09-29: qty 1

## 2014-09-29 MED ORDER — LORAZEPAM 1 MG PO TABS
1.0000 mg | ORAL_TABLET | Freq: Once | ORAL | Status: AC
  Administered 2014-09-29: 1 mg via ORAL

## 2014-09-29 NOTE — ED Notes (Signed)
BEHAVIORAL HEALTH ROUNDING Patient sleeping: No. Patient alert and oriented: yes Behavior appropriate: Yes.  ; If no, describe:  Nutrition and fluids offered: Yes  Toileting and hygiene offered: Yes  Sitter present: not applicable Law enforcement present: Yes  

## 2014-09-29 NOTE — ED Notes (Signed)
BEHAVIORAL HEALTH ROUNDING Patient sleeping: Yes.   Patient alert and oriented: no Behavior appropriate: Yes.  ; If no, describe:   Nutrition and fluids offered: No Toileting and hygiene offered: No Sitter present: no Law enforcement present: Yes  and ODS 

## 2014-09-29 NOTE — ED Notes (Signed)

## 2014-09-29 NOTE — ED Notes (Signed)
BEHAVIORAL HEALTH ROUNDING  Patient sleeping: No.  Patient alert and oriented: yes  Behavior appropriate: Yes. ; If no, describe:  Nutrition and fluids offered: Yes  Toileting and hygiene offered: Yes  Sitter present: not applicable  Law enforcement present: Yes ODS  

## 2014-09-29 NOTE — ED Notes (Signed)
BEHAVIORAL HEALTH ROUNDING Patient sleeping: Yes.   Patient alert and oriented: not applicable Behavior appropriate: Yes.  ; If no, describe:  Nutrition and fluids offered: No Toileting and hygiene offered: No Sitter present: not applicable Law enforcement present: Yes  

## 2014-09-29 NOTE — ED Provider Notes (Signed)
-----------------------------------------   6:38 AM on 09/29/2014 -----------------------------------------   BP 107/54 mmHg  Pulse 53  Temp(Src) 98.5 F (36.9 C) (Oral)  Resp 18  Ht 6' (1.829 m)  Wt 170 lb (77.111 kg)  BMI 23.05 kg/m2  SpO2 100%  The patient had no acute events since last update.  Calm and cooperative at this time.  Disposition is pending per Psychiatry/Behavioral Medicine team recommendations.     Rebecka ApleyAllison P Webster, MD 09/29/14 [phone]

## 2014-09-29 NOTE — ED Notes (Addendum)
ED BHU PLACEMENT JUSTIFICATION Is the patient under IVC or is there intent for IVC: Yes.   Is the patient medically cleared: Yes.   Is there vacancy in the ED BHU: Yes.   Is the population mix appropriate for patient: Yes.   Is the patient awaiting placement in inpatient or outpatient setting: Yes.   Has the patient had a psychiatric consult: Yes.   Survey of unit performed for contraband, proper placement and condition of furniture, tampering with fixtures in bathroom, shower, and each patient room: Yes.  ; Findings:  APPEARANCE/BEHAVIOR calm, cooperative and adequate rapport can be established NEURO ASSESSMENT Orientation: place and person Hallucinations: No.None noted (Hallucinations) Speech: Normal Gait: normal RESPIRATORY ASSESSMENT Normal expansion.  Clear to auscultation.  No rales, rhonchi, or wheezing. CARDIOVASCULAR ASSESSMENT regular rate and rhythm, S1, S2 normal, no murmur, click, rub or gallop GASTROINTESTINAL ASSESSMENT soft, nontender, BS WNL, no r/g EXTREMITIES normal strength, tone, and muscle mass PLAN OF CARE Provide calm/safe environment. Vital signs assessed twice daily. ED BHU Assessment once each 12-hour shift. Collaborate with intake RN daily or as condition indicates. Assure the ED provider has rounded once each shift. Provide and encourage hygiene. Provide redirection as needed. Assess for escalating behavior; address immediately and inform ED provider.  Assess family dynamic and appropriateness for visitation as needed: Yes.  ; If necessary, describe findings:  Educate the patient/family about BHU procedures/visitation: Yes.  ; If necessary, describe findings:  

## 2014-09-29 NOTE — ED Notes (Signed)
This tech received report from Brie (float tech) 

## 2014-09-29 NOTE — BH Specialist Note (Signed)
Client information has been faxed to the following facilities for inpatient consideration:  [facility]; [facility]; [facility]; and [facility].

## 2014-09-29 NOTE — ED Notes (Signed)
Pt to toilet, returned to bed, TV on 

## 2014-09-29 NOTE — ED Notes (Signed)
BEHAVIORAL HEALTH ROUNDING Patient sleeping: Yes.   Patient alert and oriented: not applicable Behavior appropriate: Yes.  ; If no, describe:  Nutrition and fluids offered: Yes  Toileting and hygiene offered: Yes  Sitter present: not applicable Law enforcement present: Yes  

## 2014-09-29 NOTE — ED Notes (Signed)
BEHAVIORAL HEALTH ROUNDING Patient sleeping: No. Patient alert and oriented: yes Behavior appropriate: Yes.  ; If no, describe:   Nutrition and fluids offered: Yes  Toileting and hygiene offered: Yes  Sitter present: no Law enforcement present: Yes  and ODS  

## 2014-09-29 NOTE — ED Notes (Signed)
ED BHU PLACEMENT JUSTIFICATION  Is the patient under IVC or is there intent for IVC: Yes.  Is the patient medically cleared: Yes.  Is there vacancy in the ED BHU: Yes.  Is the population mix appropriate for patient: Yes.  Is the patient awaiting placement in inpatient or outpatient setting: Yes.  Has the patient had a psychiatric consult: Yes.  Survey of unit performed for contraband, proper placement and condition of furniture, tampering with fixtures in bathroom, shower, and each patient room: Yes. ; Findings: All clear  APPEARANCE/BEHAVIOR  calm, cooperative and adequate rapport can be established  NEURO ASSESSMENT  Orientation: time, place and person  Hallucinations: No.None noted (Hallucinations)  Speech: Normal  Gait: normal  RESPIRATORY ASSESSMENT  WNL  CARDIOVASCULAR ASSESSMENT  WNL  GASTROINTESTINAL ASSESSMENT  WNL  EXTREMITIES  WNL  PLAN OF CARE  Provide calm/safe environment. Vital signs assessed twice daily. ED BHU Assessment once each 12-hour shift. Collaborate with intake RN daily or as condition indicates. Assure the ED provider has rounded once each shift. Provide and encourage hygiene. Provide redirection as needed. Assess for escalating behavior; address immediately and inform ED provider.  Assess family dynamic and appropriateness for visitation as needed: Yes. ; If necessary, describe findings:  Educate the patient/family about BHU procedures/visitation: Yes. ; If necessary, describe findings: Pt is calm and cooperative at this time. Pt understanding and accepting of unit procedures/rules. Will continue to monitor.  

## 2014-09-29 NOTE — ED Notes (Signed)
BEHAVIORAL HEALTH ROUNDING Patient sleeping: No. Patient alert and oriented: yes Behavior appropriate: No.; If no, describe: Pt tore up his meal tray.  Refused to eat. Nutrition and fluids offered: Yes  Toileting and hygiene offered: Yes  Sitter present: not applicable Law enforcement present: Yes

## 2014-09-29 NOTE — ED Notes (Signed)
ED BHU PLACEMENT JUSTIFICATION Is the patient under IVC or is there intent for IVC: Yes.   Is the patient medically cleared: Yes.   Is there vacancy in the ED BHU: Yes.   Is the population mix appropriate for patient: Yes.   Is the patient awaiting placement in inpatient or outpatient setting: Yes.   Has the patient had a psychiatric consult: Yes.   Survey of unit performed for contraband, proper placement and condition of furniture, tampering with fixtures in bathroom, shower, and each patient room: Yes.  ; Findings:  APPEARANCE/BEHAVIOR calm, adequate rapport can be established and uncooperative NEURO ASSESSMENT Orientation: person Hallucinations: No.None noted (Hallucinations) Speech: Normal Gait: normal RESPIRATORY ASSESSMENT Normal expansion.  Clear to auscultation.  No rales, rhonchi, or wheezing. CARDIOVASCULAR ASSESSMENT regular rate and rhythm, S1, S2 normal, no murmur, click, rub or gallop GASTROINTESTINAL ASSESSMENT soft, nontender, BS WNL, no r/g EXTREMITIES normal strength, tone, and muscle mass PLAN OF CARE Provide calm/safe environment. Vital signs assessed twice daily. ED BHU Assessment once each 12-hour shift. Collaborate with intake RN daily or as condition indicates. Assure the ED provider has rounded once each shift. Provide and encourage hygiene. Provide redirection as needed. Assess for escalating behavior; address immediately and inform ED provider.  Assess family dynamic and appropriateness for visitation as needed: Yes.  ; If necessary, describe findings:  Educate the patient/family about BHU procedures/visitation: Yes.  ; If necessary, describe findings:

## 2014-09-29 NOTE — ED Notes (Signed)
Pt out to dayroom and yelling at another pt. Pt yelling "I will kick your ass" This RN and security officer Timothy LassoRusso into dayroom and all pts sent to their rooms. Pt  back to his room and apologized to this RN and officer about his language and outburst. Pt states he is just very upset because he has spent the last 3 months getting over his girlfriend who broke up with him and the other pt had come to his door saying his girlfriend had cheated on him. Pt agitated and this RN  offered to ask the doctor for something to help calm him down and pt said he would like something.

## 2014-09-30 DIAGNOSIS — F603 Borderline personality disorder: Secondary | ICD-10-CM

## 2014-09-30 NOTE — ED Provider Notes (Signed)
-----------------------------------------   6:39 AM on 09/30/2014 -----------------------------------------   BP 127/68 mmHg  Pulse 61  Temp(Src) 98.8 F (37.1 C) (Oral)  Resp 18  Ht 6' (1.829 m)  Wt 170 lb (77.111 kg)  BMI 23.05 kg/m2  SpO2 100%  The patient had no acute events since last update.  Calm and cooperative at this time.  Disposition is pending per Psychiatry/Behavioral Medicine team recommendations.     Sharman CheekPhillip Enrigue Hashimi, MD 09/30/14 [phone]

## 2014-09-30 NOTE — ED Notes (Signed)
Milk provided    Appropriate to stimulation  No verbalized needs or concerns at this time  NAD assessed  Pt to be discharged  - awaiting EDP to do discharge paperwork  Continue to monitor

## 2014-09-30 NOTE — ED Notes (Signed)
Breakfast provided   Patient observed lying in bed with eyes closed  Even, unlabored respirations observed   NAD pt appears to be sleeping  I will continue to monitor along with every 15 minute visual observations and ongoing security camera monitoring    

## 2014-09-30 NOTE — ED Notes (Signed)

## 2014-09-30 NOTE — ED Notes (Signed)
Lunch provided.

## 2014-09-30 NOTE — ED Notes (Signed)
BEHAVIORAL HEALTH ROUNDING Patient sleeping: Yes.   Patient alert and oriented: eyes closed  Appears asleep Behavior appropriate: Yes.  ; If no, describe:  Nutrition and fluids offered: Yes  Toileting and hygiene offered: sleeping Sitter present: q 15 minute observations and security camera monitoring Law enforcement present: yes  ODS 

## 2014-09-30 NOTE — ED Notes (Signed)
BEHAVIORAL HEALTH ROUNDING Patient sleeping: No. Patient alert and oriented: yes Behavior appropriate: Yes.  ; If no, describe:  Nutrition and fluids offered: yes Toileting and hygiene offered: Yes  Sitter present: q15 minute observations and security camera monitoring Law enforcement present: Yes  ODS  

## 2014-09-30 NOTE — ED Notes (Signed)
Pt resting in his room. No further outburst noted. Pt calm at this time. Will continue to monitor.

## 2014-09-30 NOTE — ED Notes (Signed)
BEHAVIORAL HEALTH ROUNDING  Patient sleeping: No.  Patient alert and oriented: yes  Behavior appropriate: No. ; If no, describe: agitated with another pt. See note at this time Nutrition and fluids offered: Yes  Toileting and hygiene offered: Yes  Sitter present: not applicable  Law enforcement present: Yes ODS

## 2014-09-30 NOTE — ED Provider Notes (Signed)
-----------------------------------------   4:00 PM on 09/30/2014 -----------------------------------------  Patient being discharged to home. He will follow up with [facility]. Cleared for discharge by psychiatry with IVC rescinded.  Patient remains stable. Discharge plan in place.  Sharyn CreamerMark Rafik Koppel, MD 09/30/14 [phone]

## 2014-09-30 NOTE — ED Notes (Signed)
[facility] - awaiting discharge paperwork

## 2014-09-30 NOTE — ED Notes (Signed)
BEHAVIORAL HEALTH ROUNDING Patient sleeping: Yes.   Patient alert and oriented: not applicable SLEEPING Behavior appropriate: Yes.  ; If no, describe: SLEEPING Nutrition and fluids offered: No SLEEPING Toileting and hygiene offered: NoSLEEPING Sitter present: not applicable Law enforcement present: Yes ODS 

## 2014-09-30 NOTE — ED Notes (Signed)
Patient observed lying in bed with eyes closed  Even, unlabored respirations observed   NAD pt appears to be sleeping  I will continue to monitor along with every 15 minute visual observations and ongoing security camera monitoring    

## 2014-09-30 NOTE — Progress Notes (Signed)
[facility]  09/30/2014 11:09 AM Reginald Bowman  MRN:  [id number] Subjective:   Pt is a 29 yo male who [facility] presented again to the ED. He stated that he went to the bank and was escorted out. He was using MJ heavily with friends. He remains manipulative and does not have a place to live till next moth as he is completely out of money and does not have his phone.   Principal Problem: <principal problem not specified> Diagnosis:   Patient Active Problem List   Diagnosis Date Noted  . Major depressive disorder, recurrent, severe without psychotic features [F33.2]   . Tobacco use disorder [Z72.0] 09/20/2014  . Antisocial personality disorder [F60.2] 09/20/2014  . Borderline personality disorder [F60.3] 09/19/2014  . Post traumatic stress disorder (PTSD) [F43.10] 09/03/2014  . Cannabis use disorder, severe, dependence [F12.20] 08/31/2014  . Alcohol addiction [F10.20] 06/12/2013   Total Time spent with patient: 30 minutes   Past Medical History:  Past Medical History  Diagnosis Date  . Depression   . Bipolar disorder   . Chronic back pain    No past surgical history on file. Family History: No family history on file. Social History:  History  Alcohol Use No     History  Drug Use  . Yes  . Special: Marijuana    History   Social History  . Marital Status: Legally Separated    Spouse Name: N/A  . Number of Children: N/A  . Years of Education: N/A   Social History Main Topics  . Smoking status: Current Every Day Smoker    Types: Cigarettes  . Smokeless tobacco: Not on file  . Alcohol Use: No  . Drug Use: Yes    Special: Marijuana  . Sexual Activity: Not Currently     Comment: cough pills   Other Topics Concern  . Not on file   Social History Narrative   Additional History:    Sleep: Good  Appetite:  Fair   Assessment:   Musculoskeletal: Strength & Muscle Tone: within normal limits Gait & Station: normal Patient  leans: N/A   Psychiatric Specialty Exam: Physical Exam  Review of Systems  Constitutional: Negative for chills and diaphoresis.  HENT: Negative for hearing loss.   Eyes: Negative for pain.  Respiratory: Negative for sputum production.   Gastrointestinal: Negative for diarrhea.  Genitourinary: Negative for urgency.  Neurological: Negative for tremors.  Psychiatric/Behavioral: Negative for depression and suicidal ideas. The patient is nervous/anxious.     Blood pressure 117/64, pulse 93, temperature 98 F (36.7 C), temperature source Oral, resp. rate 18, height 6' (1.829 m), weight 77.111 kg (170 lb), SpO2 100 %.Body mass index is 23.05 kg/(m^2).  General Appearance: Disheveled  Eye SolicitorContact::  Fair  Speech:  Normal Rate  Volume:  Normal  Mood:  Depressed  Affect:  Congruent  Thought Process:  Logical  Orientation:  Full (Time, Place, and Person)  Thought Content:  WDL  Suicidal Thoughts:  No  Homicidal Thoughts:  No  Memory:  Immediate;   Good  Judgement:  Good  Insight:  Fair  Psychomotor Activity:  Normal  Concentration:  Fair  Recall:  FiservFair  Fund of Knowledge:Fair  Language: Fair  Akathisia:  No  Handed:  Right  AIMS (if indicated):     Assets:  Communication Skills  ADL's:  Intact  Cognition: WNL  Sleep:        Current Medications: Current Facility-Administered Medications  Medication Dose Route Frequency Provider Last Rate Last Dose  . acetaminophen (TYLENOL) tablet 650 mg  650 mg Oral Q6H PRN Beau Fanny, MD      . alum & mag hydroxide-simeth (MAALOX/MYLANTA) 200-200-20 MG/5ML suspension 30 mL  30 mL Oral Q4H PRN Beau Fanny, MD      . magnesium hydroxide (MILK OF MAGNESIA) suspension 30 mL  30 mL Oral Daily PRN Beau Fanny, MD       Current Outpatient Prescriptions  Medication Sig Dispense Refill  . haloperidol (HALDOL) 0.5 MG tablet Take 1 tablet (0.5 mg total) by mouth every 8 (eight) hours as needed for agitation. 30 tablet 0  . nicotine  (NICODERM CQ - DOSED IN MG/24 HOURS) 21 mg/24hr patch Place 1 patch (21 mg total) onto the skin daily. 28 patch 0  . pantoprazole (PROTONIX) 40 MG tablet Take 1 tablet (40 mg total) by mouth daily. (Patient not taking: Reported on 09/19/2014) 30 tablet 0    Lab Results: No results found for this or any previous visit (from the past 48 hour(s)).  Physical Findings: AIMS:  , ,  ,  ,    CIWA:    COWS:     Treatment Plan Summary: Medication management   Medical Decision Making:  Self-Limited or Minor (1)   Pt will be released from IVC and he will be discharged from ER as he does not meet the criteria at this time.  He will follow up with [facility] He has supply of medications and no new prescriptions will be dispensed.  He denied SI/HI or plans.  Pt is very manipulative and does not need admission at this time. Case discussed with [facility] staff and nursing and they agreed.      Brandy Hale 09/30/2014, 11:09 AM

## 2014-09-30 NOTE — ED Notes (Signed)
Supper provided  

## 2014-09-30 NOTE — ED Notes (Signed)
While passing meds in adjacent rooms pt was yelling "Shut the fuck up out there."  Referring to me   I peeked in the room and he is lying in bed with no clothes on and he has his shirt wrapped around this head  Pt denies any needs at this time

## 2014-09-30 NOTE — ED Notes (Addendum)
1/1 bags of belongings returned to the pt at this time  A backpack was left in the sallyport until he clears our unit   Pt is angry stating  "I cannot believe that I am homeless and ya'll are gonna cause me to cut my throat - I am homeless and ya'll are supposed to admit me downstairs so I can stay here for a while - i won't get any money until July - I will be back - ya'll are stupid and I will be back cause I will get what I want. - [place] [facility] [employer] and [place] [facility] [place] - just wait and see - see you in 10 minutes bitch."  Pt escorted through the ED lobby  - he was observed digging in the trashcan near the parking lot    Pt refused to esign his paperwork

## 2014-09-30 NOTE — ED Notes (Signed)
IVC rescinded by Dr.Faheem/ follow with [facility]

## 2014-09-30 NOTE — Discharge Instructions (Signed)
Borderline Personality Disorder °Borderline personality disorder is a mental health disorder. People with borderline personality disorder have unhealthy patterns of perceiving, thinking about, and reacting to their environment and events in their life. These patterns are established by adolescence or early adulthood. People with borderline personality disorder also have difficulty coping with stress on their own and fear being abandoned by others. They have difficulty controlling their emotions. Their emotions change quickly, frequently, and intensely. They are easily upset and can become very angry, very suddenly. Their unpredictable behavior often leads to problems in their relationships. They often feel worthless, unloved, and emotionally empty. °CAUSES °No one knows the exact cause of borderline personality disorder. Most mental health experts think that there is more than one cause. Possible contributing factors include: °· Genetic factors. These are traits that are passed down from one generation to the next. Many people with borderline personality disorder have a family history of the disorder. °· Physical factors. The part of the brain that controls emotion may be different in people who have borderline personality disorder. °· Social factors. Traumatic experiences involving other people may play a role in the development of borderline personality disorder. Examples include neglect, abandonment, and physical and sexual abuse. °SYMPTOMS °Signs and symptoms of borderline personality disorder include: °· A series of unstable personal relationships. °· A strong fear of being abandoned and frantic efforts to avoid abandonment. °· Impulsive, self-destructive behavior, such as substance abuse, irrational spending of money, unprotected sex with multiple partners, reckless driving, and binge eating. °· Poor self-image that also may change a lot, or a sense of identity that is inconsistent. °· Recurring self-injury or  attempted suicide. °· Severe mood swings, including depression, irritability, and anxiety. °· Lasting feelings of emptiness. °· Difficulty controlling anger. °· Temporary feelings of paranoia or loss of touch with reality. °DIAGNOSIS °A diagnosis of borderline personality disorder requires the presence of at least 5 of the common signs and symptoms. This information is gathered from family and friends as well as medical professionals and legal professionals who have a close association with the patient. This information is also gathered during a psychiatric assessment. During the assessment, the patient is asked about early life experiences, level of education, employment status, physical health conditions, and current prescription and over-the-counter medicines used. °TREATMENT °Caregivers who usually treat borderline personality disorder are mental health professionals, such as psychologists, psychiatrists, and clinical social workers. More than one type of treatment may be needed. Types of treatment include: °· Psychotherapy (also known as talk therapy or counseling). °· Cognitive behavioral therapy. This helps the person to recognize and change unhealthy feelings, thoughts, and behaviors. They find new, more positive thoughts and actions to replace the old ones. °· Dialectical behavioral therapy. Through this type of treatment, a person learns to understand his or her feelings and to regulate them. This may be one-on-one treatment or part of group therapy. °· Family therapy. This treatment includes family members. °· Medicine. Medicine may be used to help control emotions, reduce reckless and self-destructive behavior, treat anxiety, and treat depression. °SEEK IMMEDIATE MEDICAL CARE IF:  °· You cannot control your behavior or emotions. °· You think about hurting yourself. °· You think about suicide. °FOR MORE INFORMATION °National Alliance on Mental Illness: www.nami.org °U.S. National Institute of Mental  Health: www.nimh.nih.gov °Borderline Personality Resource Center: http://bpdresourcecenter.org °Document Released: 07/28/2010 Document Revised: 07/05/2011 Document Reviewed: 07/28/2010 °ExitCare® Patient Information ©2015 [employer]. This information is not intended to replace advice given to you by your health   care provider. Make sure you discuss any questions you have with your health care provider. ° °

## 2014-09-30 NOTE — ED Notes (Signed)

## 2015-01-01 ENCOUNTER — Encounter (HOSPITAL_COMMUNITY): Payer: Self-pay | Admitting: Orthopedic Surgery

## 2015-11-10 ENCOUNTER — Emergency Department
Admission: EM | Admit: 2015-11-10 | Discharge: 2015-11-12 | Payer: Medicaid Other | Attending: Emergency Medicine | Admitting: Emergency Medicine

## 2015-11-10 ENCOUNTER — Encounter: Payer: Self-pay | Admitting: *Deleted

## 2015-11-10 DIAGNOSIS — F1290 Cannabis use, unspecified, uncomplicated: Secondary | ICD-10-CM | POA: Diagnosis not present

## 2015-11-10 DIAGNOSIS — F1990 Other psychoactive substance use, unspecified, uncomplicated: Secondary | ICD-10-CM | POA: Diagnosis not present

## 2015-11-10 DIAGNOSIS — F1020 Alcohol dependence, uncomplicated: Secondary | ICD-10-CM | POA: Diagnosis present

## 2015-11-10 DIAGNOSIS — F1220 Cannabis dependence, uncomplicated: Secondary | ICD-10-CM | POA: Diagnosis present

## 2015-11-10 DIAGNOSIS — F602 Antisocial personality disorder: Secondary | ICD-10-CM | POA: Diagnosis present

## 2015-11-10 DIAGNOSIS — F603 Borderline personality disorder: Secondary | ICD-10-CM | POA: Diagnosis present

## 2015-11-10 DIAGNOSIS — Z79899 Other long term (current) drug therapy: Secondary | ICD-10-CM | POA: Insufficient documentation

## 2015-11-10 DIAGNOSIS — R45850 Homicidal ideations: Secondary | ICD-10-CM

## 2015-11-10 DIAGNOSIS — F17210 Nicotine dependence, cigarettes, uncomplicated: Secondary | ICD-10-CM | POA: Insufficient documentation

## 2015-11-10 DIAGNOSIS — F332 Major depressive disorder, recurrent severe without psychotic features: Secondary | ICD-10-CM | POA: Diagnosis not present

## 2015-11-10 DIAGNOSIS — F319 Bipolar disorder, unspecified: Secondary | ICD-10-CM | POA: Diagnosis present

## 2015-11-10 DIAGNOSIS — F1310 Sedative, hypnotic or anxiolytic abuse, uncomplicated: Secondary | ICD-10-CM

## 2015-11-10 LAB — COMPREHENSIVE METABOLIC PANEL
ALBUMIN: 4.1 g/dL (ref 3.5–5.0)
ALK PHOS: 62 U/L (ref 38–126)
ALT: 11 U/L — ABNORMAL LOW (ref 17–63)
AST: 16 U/L (ref 15–41)
Anion gap: 7 (ref 5–15)
BILIRUBIN TOTAL: 0.6 mg/dL (ref 0.3–1.2)
BUN: 13 mg/dL (ref 6–20)
CALCIUM: 8.7 mg/dL — AB (ref 8.9–10.3)
CO2: 23 mmol/L (ref 22–32)
Chloride: 104 mmol/L (ref 101–111)
Creatinine, Ser: 0.98 mg/dL (ref 0.61–1.24)
GFR calc non Af Amer: >60 mL/min (ref >60)
Glucose, Bld: 95 mg/dL (ref 65–99)
POTASSIUM: 3.7 mmol/L (ref 3.5–5.1)
SODIUM: 134 mmol/L — AB (ref 135–145)
Total Protein: 7 g/dL (ref 6.5–8.1)

## 2015-11-10 LAB — CBC
HCT: 44.9 % (ref 40.0–52.0)
Hemoglobin: 15.5 g/dL (ref 13.0–18.0)
MCH: 30.5 pg (ref 26.0–34.0)
MCHC: 34.5 g/dL (ref 32.0–36.0)
MCV: 88.3 fL (ref 80.0–100.0)
PLATELETS: 165 10*3/uL (ref 150–440)
RBC: 5.09 MIL/uL (ref 4.40–5.90)
RDW: 13.6 % (ref 11.5–14.5)
WBC: 10.8 10*3/uL — AB (ref 3.8–10.6)

## 2015-11-10 LAB — URINE DRUG SCREEN, QUALITATIVE (ARMC ONLY)
AMPHETAMINES, UR SCREEN: NOT DETECTED
BENZODIAZEPINE, UR SCRN: NOT DETECTED
Barbiturates, Ur Screen: NOT DETECTED
Cannabinoid 50 Ng, Ur ~~LOC~~: POSITIVE — AB
Cocaine Metabolite,Ur ~~LOC~~: NOT DETECTED
MDMA (Ecstasy)Ur Screen: NOT DETECTED
METHADONE SCREEN, URINE: NOT DETECTED
OPIATE, UR SCREEN: NOT DETECTED
PHENCYCLIDINE (PCP) UR S: NOT DETECTED
Tricyclic, Ur Screen: NOT DETECTED

## 2015-11-10 LAB — SALICYLATE LEVEL: Salicylate Lvl: <4 mg/dL (ref 2.8–30.0)

## 2015-11-10 LAB — ACETAMINOPHEN LEVEL

## 2015-11-10 LAB — ETHANOL

## 2015-11-10 NOTE — ED Notes (Signed)
Patient reports increased depression since his girlfriend left him about 2 months ago and took their son.  Patient reports recently taking too much klonopin.  Patient states, "I was worried I might really overdose, I've been taking up to 10 or 15 at a time sometimes because I feel so depressed."

## 2015-11-10 NOTE — ED Notes (Signed)
Pt. To BHU from ED ambulatory without difficulty, to room  [facility] Pt. Is alert and oriented, warm and dry in no distress. Pt. Denies HI and AVH. Pt reports having some SI pretty constaint thoughts. Patient able to contract for safety with this nurse. Pt. Calm and cooperative. Pt. Made aware of security cameras and Q15 minute rounds. Pt. Encouraged to let Nursing staff know of any concerns or needs.

## 2015-11-10 NOTE — ED Notes (Signed)
Pt given meal tray and milk. 

## 2015-11-10 NOTE — ED Notes (Signed)
ED BHU PLACEMENT JUSTIFICATION Is the patient under IVC or is there intent for IVC: Yes.   Is the patient medically cleared: Yes.   Is there vacancy in the ED BHU: Yes.   Is the population mix appropriate for patient: Yes.   Is the patient awaiting placement in inpatient or outpatient setting: No. Has the patient had a psychiatric consult: No. Survey of unit performed for contraband, proper placement and condition of furniture, tampering with fixtures in bathroom, shower, and each patient room: Yes.  ; Findings: NA APPEARANCE/BEHAVIOR calm, cooperative and adequate rapport can be established NEURO ASSESSMENT Orientation: time, place and person Hallucinations: No.None noted (Hallucinations) Speech: Normal Gait: normal RESPIRATORY ASSESSMENT Normal expansion.  Clear to auscultation.  No rales, rhonchi, or wheezing. CARDIOVASCULAR ASSESSMENT regular rate and rhythm, S1, S2 normal, no murmur, click, rub or gallop GASTROINTESTINAL ASSESSMENT soft, nontender, BS WNL, no r/g EXTREMITIES normal strength, tone, and muscle mass PLAN OF CARE Provide calm/safe environment. Vital signs assessed twice daily. ED BHU Assessment once each 12-hour shift. Collaborate with intake RN daily or as condition indicates. Assure the ED provider has rounded once each shift. Provide and encourage hygiene. Provide redirection as needed. Assess for escalating behavior; address immediately and inform ED provider.  Assess family dynamic and appropriateness for visitation as needed: Yes.  ; If necessary, describe findings: NA Educate the patient/family about BHU procedures/visitation: Yes.  ; If necessary, describe findings: NA  

## 2015-11-10 NOTE — ED Notes (Addendum)
Patient reports increased depression since his girlfriend left him about 2 months ago and took their son. Patient reports recently taking too much klonopin. Patient states, "I was worried I might really overdose, I've been taking up to 10 or 15 at a time sometimes because I feel so depressed." Patient states, 'I have thoughts of cutting girlfriend." Patient states trouble sleeping. Patient states his thoughts are constant about hurting himself and girlfriend. Patient denies any pain. Patient ambulates with steady gait. Patient came in Voluntary. Patient noted to be depressed and sad. Pt soft spoken and mumbles.

## 2015-11-10 NOTE — ED Notes (Signed)
[facility].

## 2015-11-10 NOTE — ED Notes (Signed)
States hx of depression, states constant thoughts of SI and states he enjoys thinking about hurting people and has 1 specific person in mind, states he using ETOH, marijuina and klonopin, states previous SI attempt, states 1 week ago he took 100 klonopin over the week

## 2015-11-10 NOTE — ED Notes (Signed)
[facility].
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
## 2015-11-10 NOTE — ED Notes (Signed)

## 2015-11-10 NOTE — ED Notes (Signed)
BEHAVIORAL HEALTH ROUNDING Patient sleeping: No. Patient alert and oriented: yes Behavior appropriate: Yes.  ; If no, describe:  Nutrition and fluids offered: Yes  Toileting and hygiene offered: Yes  Sitter present: q 15 min checks Law enforcement present: Yes  

## 2015-11-10 NOTE — ED Notes (Signed)
TTS at the bedside talking to pt.

## 2015-11-10 NOTE — BH Assessment (Signed)
Assessment Note  Reginald Bowman is an 30 y.o. male. Reginald Bowman arrived to the ED by way of personal vehicle with friends.  He reports that he is depressed and is having suicidal and homicidal thoughts. He states that he would overdose on Clonipine and Xanax.  He states that he wants to hurt his ex-girlfriend (States "I don't know if it would be wise to give you her name", when asked by the TTS.) He did state that he would cut her.  He reports that he is currently feeling anxious and hopeless.  He denied having auditory or visual hallucinations.  He states that at this moment he is still having suicidal thoughts and feelings of intent.  He reports using marijuana today. He reports using Clonipine and alcohol in the past few days. He reports that he has gone through a break up about 2 months ago, and he has been unable to see his son.    Diagnosis: Depression, substance abuse  Past Medical History:  Past Medical History  Diagnosis Date  . Polysubstance abuse   . Depression   . Bipolar disorder (HCC)   . Chronic back pain     History reviewed. No pertinent past surgical history.  Family History: History reviewed. No pertinent family history.  Social History:  reports that he has been smoking Cigarettes.  He does not have any smokeless tobacco history on file. He reports that he uses illicit drugs (Marijuana). He reports that he does not drink alcohol.  Additional Social History:  Alcohol / Drug Use History of alcohol / drug use?: Yes Substance #1 Name of Substance 1: Marijuana 1 - Age of First Use: 12 1 - Amount (size/oz): Varied 1 - Frequency: daily 1 - Last Use / Amount: 11/10/2015 Substance #2 Name of Substance 2: Alcohol 2 - Age of First Use: 10 2 - Amount (size/oz): 2-3  40 oz beers 2 - Frequency: 1-2 times a week 2 - Last Use / Amount: 11/07/2015 Substance #3 Name of Substance 3: Pills (Clonipine) 3 - Age of First Use: 14 3 - Amount (size/oz): Varied 3 - Frequency: 4-5 times a  week 3 - Duration: 11/07/2015  CIWA: CIWA-Ar BP: 119/68 mmHg Pulse Rate: 85 COWS:    Allergies:  Allergies  Allergen Reactions  . Shellfish Allergy Nausea And Vomiting  . Penicillins Nausea And Vomiting  . Risperidone And Related Anxiety    Home Medications:  (Not in a hospital admission)  OB/GYN Status:  No LMP for male patient.  General Assessment Data Location of Assessment: [facility] ED TTS Assessment: In system Is this a Tele or Face-to-Face Assessment?: Face-to-Face Is this an Initial Assessment or a Re-assessment for this encounter?: Initial Assessment Marital status: Separated Maiden name: n/a Is patient pregnant?: No Pregnancy Status: No Living Arrangements: Non-relatives/Friends Can pt return to current living arrangement?: Yes Admission Status: Voluntary Is patient capable of signing voluntary admission?: Yes Referral Source: Self/Family/Friend Insurance type: Medicaid  Medical Screening Exam [facility] Walk-in ONLY) Medical Exam completed: Yes  Crisis Care Plan Living Arrangements: Non-relatives/Friends Legal Guardian: Other: (Self) Name of Psychiatrist: None at this time Name of Therapist: None at this time  Education Status Is patient currently in school?: No Current Grade: n/a Highest grade of school patient has completed: 9th Name of school: [employer] person: n/a  Risk to self with the past 6 months Suicidal Ideation: Yes-Currently Present Has patient been a risk to self within the past 6 months prior to admission? : Yes  Suicidal Intent: Yes-Currently Present Has patient had any suicidal intent within the past 6 months prior to admission? : Yes Is patient at risk for suicide?: Yes Suicidal Plan?: Yes-Currently Present Has patient had any suicidal plan within the past 6 months prior to admission? : Yes Specify Current Suicidal Plan: Overdose on Pills Access to Means: No (Currently in the hospital) What has been your use of  drugs/alcohol within the last 12 months?: daily use of marijuan, use of pills multiple times a week Previous Attempts/Gestures: Yes How many times?: 1 Other Self Harm Risks: Cuts Triggers for Past Attempts: Unknown Intentional Self Injurious Behavior: Cutting Comment - Self Injurious Behavior: Self report Family Suicide History: Yes (Aunt, Grand father) Recent stressful life event(s):  (Separation from ex-girlfriend and son) Persecutory voices/beliefs?: No Depression: Yes Depression Symptoms: Despondent, Feeling worthless/self pity Substance abuse history and/or treatment for substance abuse?: Yes Suicide prevention information given to non-admitted patients: Not applicable  Risk to Others within the past 6 months Homicidal Ideation: Yes-Currently Present Does patient have any lifetime risk of violence toward others beyond the six months prior to admission? : Unknown Thoughts of Harm to Others: Yes-Currently Present Comment - Thoughts of Harm to Others: cut ex girlfriend Current Homicidal Intent: Yes-Currently Present Current Homicidal Plan: Yes-Currently Present Describe Current Homicidal Plan: He state he wants to cut his ex girlfriend with a knife Access to Homicidal Means: No Identified Victim: ex girlfriend, refused to identify her by name to TTS History of harm to others?:  (denied) Assessment of Violence: None Noted Violent Behavior Description: denied Does patient have access to weapons?: No (No access while at the hospital) Criminal Charges Pending?: No Does patient have a court date: No Is patient on probation?: No  Psychosis Hallucinations: None noted Delusions: None noted  Mental Status Report Appearance/Hygiene: In scrubs Eye Contact: Poor Motor Activity: Unremarkable Speech: Soft, Slow Level of Consciousness: Alert Mood: Depressed Affect: Flat Anxiety Level: None Thought Processes: Coherent Judgement: Unable to Assess Orientation: Person, Place, Time,  Situation Obsessive Compulsive Thoughts/Behaviors: None  Cognitive Functioning Concentration: Good Memory: Recent Intact IQ: Average Insight: Fair Impulse Control: Fair Appetite: Good Sleep: Decreased Vegetative Symptoms: None  ADLScreening [facility] Assessment Services) Patient's cognitive ability adequate to safely complete daily activities?: Yes Patient able to express need for assistance with ADLs?: Yes Independently performs ADLs?: Yes (appropriate for developmental age)  Prior Inpatient Therapy Prior Inpatient Therapy: Yes Prior Therapy Dates: 2017 Prior Therapy Facilty/Provider(s): [facility] Reason for Treatment: Depression  Prior Outpatient Therapy Prior Outpatient Therapy: Yes Prior Therapy Dates: 2014 Prior Therapy Facilty/Provider(s): [facility] Biomedical scientist(Went for 3 months) Reason for Treatment: Depression Does patient have an ACCT team?: No Does patient have Intensive In-House Services?  : No Does patient have [facility] services? : No Does patient have P4CC services?: No  ADL Screening (condition at time of admission) Patient's cognitive ability adequate to safely complete daily activities?: Yes Patient able to express need for assistance with ADLs?: Yes Independently performs ADLs?: Yes (appropriate for developmental age)       Abuse/Neglect Assessment (Assessment to be complete while patient is alone) Physical Abuse: Denies Verbal Abuse: Denies Sexual Abuse: Denies Exploitation of patient/patient's resources: Denies Self-Neglect: Denies          Additional Information 1:1 In Past 12 Months?: No CIRT Risk: No Elopement Risk: No Does patient have medical clearance?: Yes     Disposition:  Disposition Initial Assessment Completed for this Encounter: Yes Disposition of Patient: Other dispositions  On Site Evaluation by:  Reviewed with Physician:    Justice Deeds 11/10/2015 9:01 PM

## 2015-11-11 DIAGNOSIS — F332 Major depressive disorder, recurrent severe without psychotic features: Secondary | ICD-10-CM | POA: Diagnosis not present

## 2015-11-11 DIAGNOSIS — R45850 Homicidal ideations: Secondary | ICD-10-CM

## 2015-11-11 DIAGNOSIS — F1310 Sedative, hypnotic or anxiolytic abuse, uncomplicated: Secondary | ICD-10-CM

## 2015-11-11 MED ORDER — FLUOXETINE HCL 10 MG PO CAPS
10.0000 mg | ORAL_CAPSULE | Freq: Every day | ORAL | Status: DC
  Administered 2015-11-11 – 2015-11-12 (×2): 10 mg via ORAL
  Filled 2015-11-11 (×2): qty 1

## 2015-11-11 NOTE — ED Notes (Signed)
Pt given sandwich tray 

## 2015-11-11 NOTE — ED Notes (Signed)
Medication education done. 

## 2015-11-11 NOTE — ED Notes (Signed)
PT  IVC  SEEN  BY  DR  CLAPACS  PENDING  PLACEMENT 
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
## 2015-11-11 NOTE — ED Notes (Signed)
Pt laying in bed watching tv, denies any pain or discomfort at this time. Pt denies any HI or SI at this time, is calm and cooperative. Will continue to monitor.

## 2015-11-12 ENCOUNTER — Inpatient Hospital Stay
Admission: EM | Admit: 2015-11-12 | Discharge: 2015-11-18 | DRG: 885 | Disposition: A | Discharge status: Home / Self Care | Payer: Medicaid Other | Source: Intra-hospital | Attending: Psychiatry | Admitting: Psychiatry

## 2015-11-12 ENCOUNTER — Encounter: Payer: Self-pay | Admitting: Psychiatry

## 2015-11-12 DIAGNOSIS — F332 Major depressive disorder, recurrent severe without psychotic features: Secondary | ICD-10-CM | POA: Diagnosis not present

## 2015-11-12 DIAGNOSIS — F4310 Post-traumatic stress disorder, unspecified: Secondary | ICD-10-CM | POA: Diagnosis present

## 2015-11-12 DIAGNOSIS — F1310 Sedative, hypnotic or anxiolytic abuse, uncomplicated: Secondary | ICD-10-CM | POA: Diagnosis present

## 2015-11-12 DIAGNOSIS — Z880 Allergy status to penicillin: Secondary | ICD-10-CM

## 2015-11-12 DIAGNOSIS — R45850 Homicidal ideations: Secondary | ICD-10-CM | POA: Diagnosis present

## 2015-11-12 DIAGNOSIS — Z818 Family history of other mental and behavioral disorders: Secondary | ICD-10-CM

## 2015-11-12 DIAGNOSIS — R45851 Suicidal ideations: Secondary | ICD-10-CM | POA: Diagnosis present

## 2015-11-12 DIAGNOSIS — Z888 Allergy status to other drugs, medicaments and biological substances status: Secondary | ICD-10-CM

## 2015-11-12 DIAGNOSIS — F17210 Nicotine dependence, cigarettes, uncomplicated: Secondary | ICD-10-CM | POA: Diagnosis present

## 2015-11-12 DIAGNOSIS — G8929 Other chronic pain: Secondary | ICD-10-CM | POA: Diagnosis present

## 2015-11-12 DIAGNOSIS — G4700 Insomnia, unspecified: Secondary | ICD-10-CM | POA: Diagnosis present

## 2015-11-12 DIAGNOSIS — Z046 Encounter for general psychiatric examination, requested by authority: Secondary | ICD-10-CM

## 2015-11-12 DIAGNOSIS — Z590 Homelessness: Secondary | ICD-10-CM | POA: Diagnosis not present

## 2015-11-12 DIAGNOSIS — F603 Borderline personality disorder: Secondary | ICD-10-CM | POA: Diagnosis present

## 2015-11-12 DIAGNOSIS — Z915 Personal history of self-harm: Secondary | ICD-10-CM

## 2015-11-12 DIAGNOSIS — Z9119 Patient's noncompliance with other medical treatment and regimen: Secondary | ICD-10-CM

## 2015-11-12 DIAGNOSIS — F1220 Cannabis dependence, uncomplicated: Secondary | ICD-10-CM | POA: Diagnosis present

## 2015-11-12 DIAGNOSIS — F3130 Bipolar disorder, current episode depressed, mild or moderate severity, unspecified: Secondary | ICD-10-CM | POA: Diagnosis not present

## 2015-11-12 DIAGNOSIS — K219 Gastro-esophageal reflux disease without esophagitis: Secondary | ICD-10-CM | POA: Diagnosis present

## 2015-11-12 DIAGNOSIS — F17200 Nicotine dependence, unspecified, uncomplicated: Secondary | ICD-10-CM | POA: Diagnosis present

## 2015-11-12 DIAGNOSIS — F319 Bipolar disorder, unspecified: Secondary | ICD-10-CM | POA: Diagnosis present

## 2015-11-12 DIAGNOSIS — M549 Dorsalgia, unspecified: Secondary | ICD-10-CM | POA: Diagnosis present

## 2015-11-12 MED ORDER — FLUOXETINE HCL 20 MG PO CAPS
20.0000 mg | ORAL_CAPSULE | Freq: Every day | ORAL | Status: DC
  Administered 2015-11-12 – 2015-11-17 (×6): 20 mg via ORAL
  Filled 2015-11-12 (×7): qty 1

## 2015-11-12 MED ORDER — ACETAMINOPHEN 325 MG PO TABS
650.0000 mg | ORAL_TABLET | Freq: Four times a day (QID) | ORAL | Status: DC | PRN
  Filled 2015-11-12: qty 2

## 2015-11-12 MED ORDER — ALUM & MAG HYDROXIDE-SIMETH 200-200-20 MG/5ML PO SUSP
30.0000 mL | ORAL | Status: DC | PRN
  Administered 2015-11-17: 30 mL via ORAL
  Filled 2015-11-12: qty 30

## 2015-11-12 MED ORDER — MAGNESIUM HYDROXIDE 400 MG/5ML PO SUSP
30.0000 mL | Freq: Every day | ORAL | Status: DC | PRN
  Filled 2015-11-12: qty 30

## 2015-11-12 MED ORDER — QUETIAPINE FUMARATE 100 MG PO TABS
100.0000 mg | ORAL_TABLET | Freq: Every day | ORAL | Status: DC
  Administered 2015-11-12: 100 mg via ORAL
  Filled 2015-11-12: qty 1

## 2015-11-12 MED ORDER — FLUOXETINE HCL 10 MG PO CAPS: 10.0000 mg | ORAL_CAPSULE | Freq: Every day | ORAL | Status: DC

## 2015-11-12 MED ORDER — CHLORDIAZEPOXIDE HCL 25 MG PO CAPS
25.0000 mg | ORAL_CAPSULE | Freq: Four times a day (QID) | ORAL | Status: DC
  Administered 2015-11-12 (×3): 25 mg via ORAL
  Filled 2015-11-12 (×5): qty 1

## 2015-11-12 NOTE — H&P (Signed)
Psychiatric Admission Assessment Adult  Patient Identification: Reginald Bowman MRN:  [id number] Date of Evaluation:  11/12/2015 Chief Complaint:  Depression Principal Diagnosis: Bipolar I disorder, current episode depressed ([place]) Diagnosis:   Patient Active Problem List   Diagnosis Date Noted  . Post traumatic stress disorder (PTSD) [F43.10] 09/03/2014    Priority: Medium  . Cannabis use disorder, severe, dependence ([place]) [F12.20] 08/31/2014    Priority: Medium  . Major depression ([place]) [F32.9] 11/12/2015  . Sedative, hypnotic or anxiolytic use disorder, mild, abuse [F13.10] 11/11/2015  . Homicidal ideation [R45.850] 11/11/2015  . Bipolar I disorder, current episode depressed ([place]) [F31.30]   . Tobacco use disorder [F17.200] 09/20/2014  . Antisocial personality disorder [F60.2] 09/20/2014  . Borderline personality disorder [F60.3] 09/19/2014  . T8 vertebral fracture ([place]) [S22.069A] 08/13/2014  . T6 vertebral fracture ([place]) [S22.059A] 08/13/2014  . Alcohol addiction ([place]) [F10.20] 06/12/2013   History of Present Illness:   Identifying data. [name] is a 30 year old male with a history of depression, anxiety, mood instability, substance abuse, and self-injurious behavior.   Chief complaint. "I am tired of living."  History of present illness. Information was obtained from the patient and the chart. [name] has a long history of mood instability and self-injurious behaviors. In the past he has been cutting and overdosed on medications on multiple occasions. He came to the emergency room complaining of suicidal and homicidal thoughts wanting to kill his ex-girlfriend and other women. This is the same girlfriend he threatened to kill a year ago. Apparently they got back together for 8 months and then she left him for some internet guy. She is out of this area. He denies any episodes of recent cutting but has been hitting his head a lot. The patient has been feeling worse for the past 3  months. He has been using Xanax, clonazepam and marijuana to address his symptoms but felt no relief. He came to the hospital asking for help. He has been off medication for extended period of time, probably a year. Even though he has insurance now his life continues to be chaotic. He reports many symptoms of depression with poor sleep, decreased appetite, anhedonia, feeling of guilt and hopelessness worthlessness, poor energy and concentration, and social isolation, that resulted in suicidal and homicidal thinking. He reports crying spells and heightened anxiety. He reports symptoms suggestive of PTSD with nightmares and flashbacks daily. This stems from a difficult relationship with his ex-wife and inability to see his son. They have been separated for 7 years. He denies psychotic symptoms. He denies symptoms suggestive of bipolar mania. He has been abusing substances, mostly marijuana lately. He has been taking clonazepam obtained from a friend.   Past psychiatric history. The patient has been cutting and using substances for years. He was diagnosed with bipolar disorder in 2013. He was treated with lithium, Depakote, Tegretol, Minipress, Haldol, Trazodone and Cymbalta . He shakes from lithium and does not like the Depakote or Tegretol. He is unable to tolerate in the SSRIs due to sexual side effects but agreed to try Prozac here. He does not want to take antipsychotics in fear of developing weight gain and metabolic syndrome. He has been hospitalized numerous times in [place] and twice in [place]. He has multiple suicide attempts by cutting and overdose.  Family psychiatric history. His mother and his grandfather on the maternal side have bipolar. Grandfather shot himself.  Social history. He grew up all over [place]. He has GED. 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 His social situation has been chaotic since. He is currently homeless. He receives  disability and Social Security checks that is less than $500. He has Medicaid.    Total Time spent with patient: 1 hour  Is the patient at risk to self? Yes.    Has the patient been a risk to self in the past 6 months? Yes.    Has the patient been a risk to self within the distant past? Yes.    Is the patient a risk to others? Yes.    Has the patient been a risk to others in the past 6 months? No.  Has the patient been a risk to others within the distant past? No.   Prior Inpatient Therapy:   Prior Outpatient Therapy:    Alcohol Screening:   Substance Abuse History in the last 12 months:  Yes.   Consequences of Substance Abuse: Negative Previous Psychotropic Medications: Yes  Psychological Evaluations: No  Past Medical History:  Past Medical History  Diagnosis Date  . Polysubstance abuse   . Depression   . Bipolar disorder ([place])   . Chronic back pain    History reviewed. No pertinent past surgical history. Family History: History reviewed. No pertinent family history.  Tobacco Screening: @FLOW [phone]::1)@ Social History:  History  Alcohol Use No     History  Drug Use  . Yes  . Special: Marijuana    Additional Social History:                           Allergies:   Allergies  Allergen Reactions  . Shellfish Allergy Nausea And Vomiting  . Penicillins Nausea And Vomiting    Has patient had a PCN reaction causing immediate rash, facial/tongue/throat swelling, SOB or lightheadedness with hypotension: No Has patient had a PCN reaction causing severe rash involving mucus membranes or skin necrosis: No Has patient had a PCN reaction that required hospitalization No Has patient had a PCN reaction occurring within the last 10 years: No If all of the above answers are "NO", then may proceed with Cephalosporin use.   Marland Kitchen Risperidone And Related Anxiety   Lab Results:  Results for orders placed or performed during the hospital encounter of 11/10/15 (from the  past 48 hour(s))  Comprehensive metabolic panel     Status: Abnormal   Collection Time: 11/10/15  6:36 PM  Result Value Ref Range   Sodium 134 (L) 135 - 145 mmol/L   Potassium 3.7 3.5 - 5.1 mmol/L   Chloride 104 101 - 111 mmol/L   CO2 23 22 - 32 mmol/L   Glucose, Bld 95 65 - 99 mg/dL   BUN 13 6 - 20 mg/dL   Creatinine, Ser 0.98 0.61 - 1.24 mg/dL   Calcium 8.7 (L) 8.9 - 10.3 mg/dL   Total Protein 7.0 6.5 - 8.1 g/dL   Albumin 4.1 3.5 - 5.0 g/dL   AST 16 15 - 41 U/L   ALT 11 (L) 17 - 63 U/L   Alkaline Phosphatase 62 38 - 126 U/L   Total Bilirubin 0.6 0.3 - 1.2 mg/dL   GFR calc non Af Amer >60 >60 mL/min   GFR calc Af Amer >60 >60 mL/min    Comment: (NOTE) The eGFR has been calculated using the CKD EPI equation. This calculation has not been validated in all clinical situations. eGFR's persistently <60 mL/min signify possible Chronic Kidney  Disease.    Anion gap 7 5 - 15  Ethanol     Status: None   Collection Time: 11/10/15  6:36 PM  Result Value Ref Range   Alcohol, Ethyl (B) <5 <5 mg/dL    Comment:        LOWEST DETECTABLE LIMIT FOR SERUM ALCOHOL IS 5 mg/dL FOR MEDICAL PURPOSES ONLY   Salicylate level     Status: None   Collection Time: 11/10/15  6:36 PM  Result Value Ref Range   Salicylate Lvl <9.0 2.8 - 30.0 mg/dL  Acetaminophen level     Status: Abnormal   Collection Time: 11/10/15  6:36 PM  Result Value Ref Range   Acetaminophen (Tylenol), Serum <10 (L) 10 - 30 ug/mL    Comment:        THERAPEUTIC CONCENTRATIONS VARY SIGNIFICANTLY. A RANGE OF 10-30 ug/mL MAY BE AN EFFECTIVE CONCENTRATION FOR MANY PATIENTS. HOWEVER, SOME ARE BEST TREATED AT CONCENTRATIONS OUTSIDE THIS RANGE. ACETAMINOPHEN CONCENTRATIONS >150 ug/mL AT 4 HOURS AFTER INGESTION AND >50 ug/mL AT 12 HOURS AFTER INGESTION ARE OFTEN ASSOCIATED WITH TOXIC REACTIONS.   cbc     Status: Abnormal   Collection Time: 11/10/15  6:36 PM  Result Value Ref Range   WBC 10.8 (H) 3.8 - 10.6 K/uL   RBC 5.09  4.40 - 5.90 MIL/uL   Hemoglobin 15.5 13.0 - 18.0 g/dL   HCT 44.9 40.0 - 52.0 %   MCV 88.3 80.0 - 100.0 fL   MCH 30.5 26.0 - 34.0 pg   MCHC 34.5 32.0 - 36.0 g/dL   RDW 13.6 11.5 - 14.5 %   Platelets 165 150 - 440 K/uL  Urine Drug Screen, Qualitative     Status: Abnormal   Collection Time: 11/10/15  6:48 PM  Result Value Ref Range   Tricyclic, Ur Screen NONE DETECTED NONE DETECTED   Amphetamines, Ur Screen NONE DETECTED NONE DETECTED   MDMA (Ecstasy)Ur Screen NONE DETECTED NONE DETECTED   Cocaine Metabolite,Ur [place] NONE DETECTED NONE DETECTED   Opiate, Ur Screen NONE DETECTED NONE DETECTED   Phencyclidine (PCP) Ur S NONE DETECTED NONE DETECTED   Cannabinoid 50 Ng, Ur [place] POSITIVE (A) NONE DETECTED   Barbiturates, Ur Screen NONE DETECTED NONE DETECTED   Benzodiazepine, Ur Scrn NONE DETECTED NONE DETECTED   Methadone Scn, Ur NONE DETECTED NONE DETECTED    Comment: (NOTE) 240  Tricyclics, urine               Cutoff 1000 ng/mL 200  Amphetamines, urine             Cutoff 1000 ng/mL 300  MDMA (Ecstasy), urine           Cutoff 500 ng/mL 400  Cocaine Metabolite, urine       Cutoff 300 ng/mL 500  Opiate, urine                   Cutoff 300 ng/mL 600  Phencyclidine (PCP), urine      Cutoff 25 ng/mL 700  Cannabinoid, urine              Cutoff 50 ng/mL 800  Barbiturates, urine             Cutoff 200 ng/mL 900  Benzodiazepine, urine           Cutoff 200 ng/mL 1000 Methadone, urine                Cutoff 300 ng/mL 1100 1200 The urine drug screen  provides only a preliminary, unconfirmed 1300 analytical test result and should not be used for non-medical 1400 purposes. Clinical consideration and professional judgment should 1500 be applied to any positive drug screen result due to possible 1600 interfering substances. A more specific alternate chemical method 1700 must be used in order to obtain a confirmed analytical result.  1800 Gas chromato graphy / mass spectrometry (GC/MS) is the  preferred 1900 confirmatory method.     Blood Alcohol level:  Lab Results  Component Value Date   ETH <5 11/10/2015   ETH 7* 94/76/5465    Metabolic Disorder Labs:  No results found for: HGBA1C, MPG No results found for: PROLACTIN Lab Results  Component Value Date   CHOL  11/22/2009    160        ATP III CLASSIFICATION:  <200     mg/dL   Desirable  200-239  mg/dL   Borderline High  >=240    mg/dL   High          TRIG 101 11/22/2009   HDL 33* 11/22/2009   CHOLHDL 4.8 11/22/2009   VLDL 20 11/22/2009   LDLCALC * 11/22/2009    107        Total Cholesterol/HDL:CHD Risk Coronary Heart Disease Risk Table                     Men   Women  1/2 Average Risk   3.4   3.3  Average Risk       5.0   4.4  2 X Average Risk   9.6   7.1  3 X Average Risk  23.4   11.0        Use the calculated Patient Ratio above and the CHD Risk Table to determine the patient's CHD Risk.        ATP III CLASSIFICATION (LDL):  <100     mg/dL   Optimal  100-129  mg/dL   Near or Above                    Optimal  130-159  mg/dL   Borderline  160-189  mg/dL   High  >190     mg/dL   Very High    Current Medications: Current Facility-Administered Medications  Medication Dose Route Frequency Provider Last Rate Last Dose  . acetaminophen (TYLENOL) tablet 650 mg  650 mg Oral Q6H PRN Gonzella Lex, MD      . alum & mag hydroxide-simeth (MAALOX/MYLANTA) 200-200-20 MG/5ML suspension 30 mL  30 mL Oral Q4H PRN Gonzella Lex, MD      . Derrill Memo ON 11/13/2015] FLUoxetine (PROZAC) capsule 10 mg  10 mg Oral Daily John T Clapacs, MD      . magnesium hydroxide (MILK OF MAGNESIA) suspension 30 mL  30 mL Oral Daily PRN Gonzella Lex, MD       PTA Medications: Prescriptions prior to admission  Medication Sig Dispense Refill Last Dose  . divalproex (DEPAKOTE) 250 MG DR tablet Take 1 tablet by mouth 2 (two) times daily.   unknown at unknown  . DULoxetine (CYMBALTA) 30 MG capsule Take 1 capsule by mouth every 12  (twelve) hours.   unknown at unknown  . haloperidol (HALDOL) 0.5 MG tablet Take 1 tablet (0.5 mg total) by mouth every 8 (eight) hours as needed for agitation. 30 tablet 0 prn at prn  . traZODone (DESYREL) 50 MG tablet Take 2 tablets by mouth at bedtime.  unknown at unknown    Musculoskeletal: Strength & Muscle Tone: within normal limits Gait & Station: normal Patient leans: N/A  Psychiatric Specialty Exam: Physical Exam  Nursing note and vitals reviewed. Constitutional: He is oriented to person, place, and time. He appears well-developed and well-nourished.  HENT:  Head: Normocephalic and atraumatic.  Eyes: Conjunctivae are normal. Pupils are equal, round, and reactive to light.  Neck: Normal range of motion. Neck supple.  Cardiovascular: Normal rate, regular rhythm and normal heart sounds.   Respiratory: Effort normal and breath sounds normal.  GI: Soft. Bowel sounds are normal.  Musculoskeletal: Normal range of motion.  Neurological: He is alert and oriented to person, place, and time.  Skin: Skin is warm and dry.    Review of Systems  Psychiatric/Behavioral: Positive for depression, suicidal ideas and substance abuse. The patient is nervous/anxious.   All other systems reviewed and are negative.   There were no vitals taken for this visit.There is no weight on file to calculate BMI.  See SRA.                                                         Treatment Plan Summary: Daily contact with patient to assess and evaluate symptoms and progress in treatment and Medication management   Mr. Lant is a 30 year old male with history of bipolar disorder, personality disorder, self-injurious behavior and substance abuse admitted for suicidal and homicidal threats in the context of substance treatment noncompliance.  1. Suicidal and homicidal ideation. The patient is able to contract for safety in the hospital.  2. Mood. He was started on Prozac in the  emergency room with depression. He refuses lithium, Tegretol, Depakote or Haldol.  3. Insomnia. Trazodone is available.  4. Smoking. Nicotine patch is available.  5 GERD. Protonix is available.  6. Benzodiazepine use. We will offer brief Librium taper.   7. Substance abuse treatment. The patient minimizes problems and declines treatment. He believes marijuana is his best medication.  8.  Disposition. To be established. The patient is homeless and not welcome at our homeless shelter.   Observation Level/Precautions:  15 minute checks  Laboratory:  CBC Chemistry Profile UDS UA  Psychotherapy:    Medications:    Consultations:    Discharge Concerns:    Estimated LOS:  Other:     I certify that inpatient services furnished can reasonably be expected to improve the patient's condition.    Orson Slick, MD 7/19/201711:41 AM

## 2015-11-12 NOTE — Clinical Social Work Note (Signed)
[facility], Shelby DubinChervonne Thompson [phone]), called and offered help w disposition.  Santa GeneraAnne Mayley Lish, LCSW Lead Clinical Social Worker Phone:  [phone]

## 2015-11-12 NOTE — ED Notes (Signed)
Patient currently denies SI/HI/AVH and pain. Patient endorses depression and anxiety and is accepting of the plan to go to the inpatient unit. Patient is calm and cooperative. No signs of distress noted. Maintained on 15 minute checks and observation by security camera for safety.

## 2015-11-12 NOTE — BH Assessment (Signed)
[facility] Dr. Toni Amendlapacs.  Attending Physician will be Dr. Jennet MaduroPucilowska.   Patient has [facility] room 304, [facility] Gwen.   Intake Paper Work has been signed and placed on patient chart.  ER staff is aware of the admission Rivka Barbara(Glenda, ER Sect.; Dr. Darnelle CatalanMalinda, ER MD; Kasandra KnudsenKarena Patient's Nurse & Byrd HesselbachMaria, Patient Access).

## 2015-11-12 NOTE — ED Notes (Signed)
Patient observed with no unusual behavior or acute distress. Patient with no verbalized needs or c/o at this time.... will continue to monitor and follow up as needed. Security staff monitoring patient on [employer] system.  

## 2015-11-12 NOTE — BHH Suicide Risk Assessment (Signed)
[facility] Admission Suicide Risk Assessment   Nursing information obtained from:    Demographic factors:    Current Mental Status:    Loss Factors:    Historical Factors:    Risk Reduction Factors:     Total Time spent with patient: 1 hour Principal Problem: Bipolar I disorder, current episode depressed ([facility]) Diagnosis:   Patient Active Problem List   Diagnosis Date Noted  . Post traumatic stress disorder (PTSD) [F43.10] 09/03/2014    Priority: Medium  . Cannabis use disorder, severe, dependence ([facility]) [F12.20] 08/31/2014    Priority: Medium  . Sedative, hypnotic or anxiolytic use disorder, mild, abuse [F13.10] 11/11/2015  . Homicidal ideation [R45.850] 11/11/2015  . Bipolar I disorder, current episode depressed ([facility]) [F31.30]   . Tobacco use disorder [F17.200] 09/20/2014  . Antisocial personality disorder [F60.2] 09/20/2014  . Borderline personality disorder [F60.3] 09/19/2014  . T8 vertebral fracture ([facility]) [S22.069A] 08/13/2014  . T6 vertebral fracture ([facility]) [S22.059A] 08/13/2014  . Alcohol addiction ([facility]) [F10.20] 06/12/2013   Subjective Data: Depression, mood instability, suicidal and homicidal ideation, substance abuse.  Continued Clinical Symptoms:    The "Alcohol Use Disorders Identification Test", Guidelines for Use in Primary Care, Second Edition.  World Science writer [facility]). Score between 0-7:  no or low risk or alcohol related problems. Score between 8-15:  moderate risk of alcohol related problems. Score between 16-19:  high risk of alcohol related problems. Score 20 or above:  warrants further diagnostic evaluation for alcohol dependence and treatment.   CLINICAL FACTORS:   Severe Anxiety and/or Agitation Bipolar Disorder:   Depressive phase Depression:   Comorbid alcohol abuse/dependence Impulsivity Alcohol/Substance Abuse/Dependencies Personality Disorders:   Cluster B   Musculoskeletal: Strength & Muscle Tone: within normal limits Gait & Station:  normal Patient leans: N/A  Psychiatric Specialty Exam: Physical Exam  Nursing note and vitals reviewed.   Review of Systems  Musculoskeletal: Positive for back pain.  Psychiatric/Behavioral: Positive for depression, suicidal ideas and substance abuse. The patient is nervous/anxious.   All other systems reviewed and are negative.   There were no vitals taken for this visit.There is no weight on file to calculate BMI.  General Appearance: Fairly Groomed  Eye Contact:  Fair  Speech:  Clear and Coherent  Volume:  Normal  Mood:  Depressed, Hopeless and Worthless  Affect:  Congruent and Labile  Thought Process:  Goal Directed  Orientation:  Full (Time, Place, and Person)  Thought Content:  WDL  Suicidal Thoughts:  Yes.  with intent/plan  Homicidal Thoughts:  Yes.  with intent/plan  Memory:  Immediate;   Fair Recent;   Fair Remote;   Fair  Judgement:  Poor  Insight:  Lacking  Psychomotor Activity:  Normal  Concentration:  Concentration: Fair and Attention Span: Fair  Recall:  [employer] of Knowledge:  Fair  Language:  Fair  Akathisia:  No  Handed:  Right  AIMS (if indicated):     Assets:  Communication Skills Desire for Improvement Financial Resources/Insurance Physical Health Resilience  ADL's:  Intact  Cognition:  WNL  Sleep:         COGNITIVE FEATURES THAT CONTRIBUTE TO RISK:  None    SUICIDE RISK:   Moderate:  Frequent suicidal ideation with limited intensity, and duration, some specificity in terms of plans, no associated intent, good self-control, limited dysphoria/symptomatology, some risk factors present, and identifiable protective factors, including available and accessible social support.  PLAN OF CARE: Hospital admission, medication management, substance abuse counseling, discharge planning.  Reginald Bowman is a 30 year old male with history of bipolar disorder, personality disorder, self-injurious behavior and substance abuse admitted for suicidal and  homicidal threats in the context of substance treatment noncompliance.  1. Suicidal and homicidal ideation. The patient is able to contract for safety in the hospital.  2. Mood. He was started on Prozac in the emergency room with depression. He refuses lithium, Tegretol, Depakote or Haldol.  3. Insomnia. Trazodone is available.  4. Smoking. Nicotine patch is available.  5 GERD. Protonix is available.  6. Benzodiazepine use. We will offer brief Librium taper.   7. Substance abuse treatment. The patient minimizes problems and declines treatment. He believes marijuana is his best medication.  8.  Disposition. To be established. The patient is homeless and not welcome at our homeless shelter.   I certify that inpatient services furnished can reasonably be expected to improve the patient's condition.   Kristine LineaJolanta Xaiver Roskelley, MD 11/12/2015, 11:35 AM

## 2015-11-12 NOTE — ED Notes (Signed)
Patient asleep in room. No noted distress or abnormal behavior. Will continue 15 minute checks and observation by security cameras for safety. 

## 2015-11-12 NOTE — [facility] Group Notes (Signed)
[facility] Group Notes:  (Nursing/MHT/Case Management/Adjunct)  Date:  11/12/2015  Time:  3:56 PM  Type of Therapy:  Psychoeducational Skills  Participation Level:  Did Not Attend  Participation Quality:   Summary of Progress/Problems:  Mayra NeerJackie L Channah Godeaux 11/12/2015, 3:56 PM

## 2015-11-12 NOTE — Progress Notes (Signed)
Patient is depressed but  cooperative during admission assessment. Patient denies SI/HI at this time. Patient denies AVH. Patient informed of fall risk status, fall risk assessed "low" at this time. Patient oriented to unit/staff/room. Patient denies any questions/concerns at this time. Patient safe on unit with Q15 minute checks for safety. Skin assessment & body search done.No contraband found.

## 2015-11-12 NOTE — ED Notes (Signed)
ENVIRONMENTAL ASSESSMENT Potentially harmful objects out of patient reach: Yes Personal belongings secured: Yes Patient dressed in hospital provided attire only: Yes Plastic bags out of patient reach: Yes Patient care equipment (cords, cables, call bells, lines, and drains) shortened, removed, or accounted for: Yes Equipment and supplies removed from bottom of stretcher: Yes Potentially toxic materials out of patient reach: Yes Sharps container removed or out of patient reach: Yes  Patient is currently in room sleeping. No signs of distress noted. Maintained on 15 minute checks and observation by security camera for safety.  

## 2015-11-12 NOTE — ED Provider Notes (Signed)
-----------------------------------------   8:40 AM on 11/12/2015 -----------------------------------------   Blood pressure 118/63, pulse 56, temperature 98.2 F (36.8 C), temperature source Oral, resp. rate 18, height 5\' 11"  (1.803 m), weight 185 lb (83.915 kg), SpO2 99 %.  The patient had no acute events since last update.  Calm and cooperative at this time.  Disposition is pending per Psychiatry/Behavioral Medicine team recommendations.     Arnaldo NatalPaul F Zaelynn Fuchs, MD 11/12/15 [phone]

## 2015-11-12 NOTE — Tx Team (Signed)
Initial Interdisciplinary Treatment Plan   PATIENT STRESSORS: Marital or family conflict Medication change or noncompliance Substance abuse   PATIENT STRENGTHS: Average or above average intelligence Communication skills Physical Health   PROBLEM LIST: Problem List/Patient Goals Date to be addressed Date deferred Reason deferred Estimated date of resolution  Depression 11/12/2015     Suicidal ideations 11/12/2015                                                DISCHARGE CRITERIA:  Ability to meet basic life and health needs Reduction of life-threatening or endangering symptoms to within safe limits  PRELIMINARY DISCHARGE PLAN: Attend aftercare/continuing care group Placement in alternative living arrangements  PATIENT/FAMIILY INVOLVEMENT: This treatment plan has been presented to and reviewed with the patient, Rise PaganiniMatthew Mensch, and/or family member,  The patient and family have been given the opportunity to ask questions and make suggestions.  Margo CommonGigi George Collette Pescador 11/12/2015, 3:05 PM

## 2015-11-12 NOTE — ED Notes (Signed)
Patient denies SI/HI/AVH and pain. All belongings returned to patient. Patient escorted to inpatient unit with police. Patient agreeable to plan to be admitted.

## 2015-11-13 MED ORDER — QUETIAPINE FUMARATE 200 MG PO TABS
200.0000 mg | ORAL_TABLET | Freq: Every day | ORAL | Status: DC
  Administered 2015-11-13: 200 mg via ORAL
  Filled 2015-11-13: qty 1

## 2015-11-13 MED ORDER — CHLORDIAZEPOXIDE HCL 10 MG PO CAPS
10.0000 mg | ORAL_CAPSULE | Freq: Four times a day (QID) | ORAL | Status: DC
  Administered 2015-11-13: 10 mg via ORAL
  Filled 2015-11-13 (×2): qty 1

## 2015-11-13 MED ORDER — OLANZAPINE 10 MG PO TABS
10.0000 mg | ORAL_TABLET | Freq: Once | ORAL | Status: AC
  Administered 2015-11-13: 10 mg via ORAL
  Filled 2015-11-13: qty 1

## 2015-11-13 NOTE — Progress Notes (Signed)
D: Pt denies SI/HI/AVH. Pt is pleasant and cooperative, affect is flat but brightens upon approach. Pt appears less anxious and he is interacting with peers and staff appropriately.  A: Pt was offered support and encouragement. Pt was given scheduled medications. Pt was encouraged to attend groups. Q 15 minute checks were done for safety.  R:Pt did not attend group. Pt is taking medication. Pt has no complaints.Pt receptive to treatment and safety maintained on unit.

## 2015-11-13 NOTE — [facility] Group Notes (Signed)
[facility] LCSW Group Therapy  11/13/2015 1:21 PM  Type of Therapy:  Group Therapy  Participation Level:  Did Not Attend  Modes of Intervention:  Discussion, Education, Socialization and Support  Summary of Progress/Problems: Balance in life: Patients will discuss the concept of balance and how it looks and feels to be unbalanced. Pt will identify areas in their life that is unbalanced and ways to become more balanced.    Charitie Hinote L Soul Deveney MSW, LCSWA  11/13/2015, 1:21 PM   

## 2015-11-13 NOTE — Tx Team (Signed)
Interdisciplinary Treatment Plan Update (Adult)  Date:  11/13/2015 Time Reviewed:  12:04 PM  Progress in Treatment: Attending groups: No. Participating in groups:  No. Taking medication as prescribed:  Yes. Tolerating medication:  Yes. Family/Significant othe contact made:  No, will contact:  CSW assessing  Patient understands diagnosis:  Yes. Discussing patient identified problems/goals with staff:  Yes. Medical problems stabilized or resolved:  Yes. Denies suicidal/homicidal ideation: Yes. Issues/concerns per patient self-inventory:  Yes. Other:  New problem(s) identified: No, Describe:  NA  Discharge Plan or Barriers:CSW assessing.   Reason for Continuation of Hospitalization: Homicidal ideation Medication stabilization Suicidal ideation  Depression   Comments: He reports that he feels very angry today and wants to kill people but is able to contract for safety in the hospital. He believes that the only solution to his problem is commit suicide or a crime to be put in prison preferably in solitary confinement. He is utterly unhappy. In spite of suicidal thinking he is worrying about his disposition. He does not believe that he can survive the homeless shelter in [place] but has no other ideas. He accepted Seroquel and Prozac last night. His hygiene is much better today. There are no somatic complaints.  Estimated length of stay: 5 days   New goal(s): NA  Review of initial/current patient goals per problem list:   1.  Goal(s): Patient will participate in aftercare plan * Met:  * Target date: at discharge * As evidenced by: Patient will participate within aftercare plan AEB aftercare provider and housing plan at discharge being identified.   2.  Goal (s): Patient will exhibit decreased depressive symptoms and suicidal ideations. * Met:  *  Target date: at discharge * As evidenced by: Patient will utilize self rating of depression at 3 or below and demonstrate decreased  signs of depression or be deemed stable for discharge by MD.   3.  Goal(s): Patient will demonstrate decreased signs and symptoms of anxiety. * Met:  * Target date: at discharge * As evidenced by: Patient will utilize self rating of anxiety at 3 or below and demonstrated decreased signs of anxiety, or be deemed stable for discharge by MD  Attendees: Patient:  Reginald Bowman 7/20/201712:04 PM  Family:   7/20/201712:04 PM  Physician:   Dr. Bary Leriche  7/20/201712:04 PM  Nursing:   Elige Radon, RN  7/20/201712:04 PM  Case Manager:   7/20/201712:04 PM  Counselor:   7/20/201712:04 PM  Other:  Wray Kearns, [place] 7/20/201712:04 PM  Other:  Anthoney Harada, [place] 7/20/201712:04 PM  Other:   7/20/201712:04 PM  Other:  7/20/201712:04 PM  Other:  7/20/201712:04 PM  Other:  7/20/201712:04 PM  Other:  7/20/201712:04 PM  Other:  7/20/201712:04 PM  Other:  7/20/201712:04 PM  Other:   7/20/201712:04 PM   Scribe for Treatment Team:   Wray Kearns, MSW, [place]  11/13/2015, 12:04 PM

## 2015-11-13 NOTE — Plan of Care (Signed)
Problem: Coping: Goal: Ability to demonstrate self-control will improve Outcome: Progressing Improving.

## 2015-11-13 NOTE — Progress Notes (Signed)
[facility] MD Progress Note  11/13/2015 8:39 AM Reginald Bowman  MRN:  [id number]  Subjective:  Mr. Linford met with treatment team today. He reports that he feels very angry today and wants to kill people but is able to contract for safety in the hospital. He believes that the only solution to his problem is commit suicide or a crime to be put in prison preferably in solitary confinement. He is utterly unhappy. In spite of suicidal thinking he is worrying about his disposition. He does not believe that he can survive the homeless shelter in [place] but has no other ideas. He accepted Seroquel and Prozac last night. His hygiene is much better today. There are no somatic complaints.  Principal Problem: Bipolar I disorder, current episode depressed ([place]) Diagnosis:   Patient Active Problem List   Diagnosis Date Noted  . Post traumatic stress disorder (PTSD) [F43.10] 09/03/2014    Priority: Medium  . Cannabis use disorder, severe, dependence ([place]) [F12.20] 08/31/2014    Priority: Medium  . Involuntary commitment [Z04.6] 11/12/2015  . Sedative, hypnotic or anxiolytic use disorder, mild, abuse [F13.10] 11/11/2015  . Homicidal ideation [R45.850] 11/11/2015  . Bipolar I disorder, current episode depressed ([place]) [F31.30]   . Tobacco use disorder [F17.200] 09/20/2014  . Antisocial personality disorder [F60.2] 09/20/2014  . Borderline personality disorder [F60.3] 09/19/2014  . T8 vertebral fracture ([place]) [S22.069A] 08/13/2014  . T6 vertebral fracture ([place]) [S22.059A] 08/13/2014  . Alcohol addiction ([place]) [F10.20] 06/12/2013   Total Time spent with patient: 20 minutes  Past Psychiatric History: Bipolar disorder.  Past Medical History:  Past Medical History  Diagnosis Date  . Polysubstance abuse   . Depression   . Bipolar disorder ([place])   . Chronic back pain    History reviewed. No pertinent past surgical history. Family History: History reviewed. No pertinent family history. Family Psychiatric   History: Bipolar disorder and completed suicide. Social History:  History  Alcohol Use No     History  Drug Use  . Yes  . Special: Marijuana    Social History   Social History  . Marital Status: Legally Separated    Spouse Name: N/A  . Number of Children: N/A  . Years of Education: N/A   Social History Main Topics  . Smoking status: Current Every Day Smoker    Types: Cigarettes  . Smokeless tobacco: None  . Alcohol Use: No  . Drug Use: Yes    Special: Marijuana  . Sexual Activity: Not Currently     Comment: cough pills   Other Topics Concern  . None   Social History Narrative   ** Merged History Encounter **       Additional Social History:      Name of Substance 1: Marijuana 1 - Age of First Use: 31yr 1 - Amount (size/oz): As much as he could get 1 - Frequency: 34/week                  Sleep: Fair  Appetite:  Fair  Current Medications: Current Facility-Administered Medications  Medication Dose Route Frequency Provider Last Rate Last Dose  . acetaminophen (TYLENOL) tablet 650 mg  650 mg Oral Q6H PRN JGonzella Lex MD      . alum & mag hydroxide-simeth (MAALOX/MYLANTA) 200-200-20 MG/5ML suspension 30 mL  30 mL Oral Q4H PRN JGonzella Lex MD      . chlordiazePOXIDE (LIBRIUM) capsule 25 mg  25 mg Oral QID JClovis Fredrickson MD   25 mg  at 11/12/15 2153  . FLUoxetine (PROZAC) capsule 20 mg  20 mg Oral QHS Clovis Fredrickson, MD   20 mg at 11/12/15 2153  . magnesium hydroxide (MILK OF MAGNESIA) suspension 30 mL  30 mL Oral Daily PRN Gonzella Lex, MD      . QUEtiapine (SEROQUEL) tablet 100 mg  100 mg Oral QHS Clariza Sickman B Rosanna Bickle, MD   100 mg at 11/12/15 2153    Lab Results: No results found for this or any previous visit (from the past 45 hour(s)).  Blood Alcohol level:  Lab Results  Component Value Date   ETH <5 11/10/2015   ETH 7* 40/98/1191    Metabolic Disorder Labs: No results found for: HGBA1C, MPG No results found for:  PROLACTIN Lab Results  Component Value Date   CHOL  11/22/2009    160        ATP III CLASSIFICATION:  <200     mg/dL   Desirable  200-239  mg/dL   Borderline High  >=240    mg/dL   High          TRIG 101 11/22/2009   HDL 33* 11/22/2009   CHOLHDL 4.8 11/22/2009   VLDL 20 11/22/2009   LDLCALC * 11/22/2009    107        Total Cholesterol/HDL:CHD Risk Coronary Heart Disease Risk Table                     Men   Women  1/2 Average Risk   3.4   3.3  Average Risk       5.0   4.4  2 X Average Risk   9.6   7.1  3 X Average Risk  23.4   11.0        Use the calculated Patient Ratio above and the CHD Risk Table to determine the patient's CHD Risk.        ATP III CLASSIFICATION (LDL):  <100     mg/dL   Optimal  100-129  mg/dL   Near or Above                    Optimal  130-159  mg/dL   Borderline  160-189  mg/dL   High  >190     mg/dL   Very High    Physical Findings: AIMS:  , ,  ,  , Dental Status Current problems with teeth and/or dentures?: No Does patient usually wear dentures?: No  CIWA:    COWS:     Musculoskeletal: Strength & Muscle Tone: within normal limits Gait & Station: normal Patient leans: N/A  Psychiatric Specialty Exam: Physical Exam  Nursing note and vitals reviewed.   Review of Systems  Psychiatric/Behavioral: Positive for depression, suicidal ideas, hallucinations and substance abuse. The patient is nervous/anxious and has insomnia.   All other systems reviewed and are negative.   Blood pressure 121/63, pulse 52, temperature 98.5 F (36.9 C), temperature source Oral, resp. rate 18, height _0  (1.803 m), weight 81.647 kg (180 lb), SpO2 99 %.Body mass index is 25.12 kg/(m^2).  General Appearance: Disheveled  Eye Contact:  Fair  Speech:  Clear and Coherent  Volume:  Increased  Mood:  Angry, Dysphoric and Irritable  Affect:  Congruent and Labile  Thought Process:  Disorganized  Orientation:  Full (Time, Place, and Person)  Thought Content:   Delusions, Hallucinations: Auditory and Paranoid Ideation  Suicidal Thoughts:  Yes.  with intent/plan  Homicidal  Thoughts:  No  Memory:  Immediate;   Fair Recent;   Fair Remote;   Fair  Judgement:  Poor  Insight:  Lacking  Psychomotor Activity:  Psychomotor Retardation  Concentration:  Concentration: Fair and Attention Span: Fair  Recall:  [employer] of Knowledge:  Fair  Language:  Fair  Akathisia:  No  Handed:  Right  AIMS (if indicated):     Assets:  Communication Skills Desire for Improvement Financial Resources/Insurance Physical Health Resilience  ADL's:  Intact  Cognition:  WNL  Sleep:  Number of Hours: 6.15     Treatment Plan Summary: Daily contact with patient to assess and evaluate symptoms and progress in treatment and Medication management   Mr. Gilkeson is a 30 year old male with history of bipolar disorder, personality disorder, self-injurious behavior and substance abuse admitted for suicidal and homicidal threats in the context of substance treatment noncompliance.  1. Suicidal and homicidal ideation. The patient is able to contract for safety in the hospital.  2. Mood. He was started on Prozac for depression and Seroquel for mood stabilization. He refuses lithium, Tegretol, Depakote or Haldol.  3. Insomnia. Trazodone is available.  4. Smoking. Nicotine patch is available.  5 GERD. Protonix is available.  6. Benzodiazepine use. We will lower Librium to 10 mg.    7. Substance abuse treatment. The patient minimizes problems and declines treatment. He believes marijuana is his best medication.  8. Disposition. To be established. The patient is homeless and not welcome at our homeless shelter.  Orson Slick, MD 11/13/2015, 8:39 AM

## 2015-11-13 NOTE — Progress Notes (Addendum)
Patient with sad affect, withdrawn and sleepy in bed this am refusing am Librium, stating "I don't need that". Minimal interaction with peers. Covers head with blanket and irritable when nurse initiates interaction. Then presents with increasing anxiety and Librium administered as ordered with good effect on s/s of withdrawal. Safety maintained. Patient refuses 2 additional doses of Librium with no complaints. Eats evening meal.

## 2015-11-14 MED ORDER — QUETIAPINE FUMARATE 200 MG PO TABS
400.0000 mg | ORAL_TABLET | Freq: Every day | ORAL | Status: DC
  Administered 2015-11-14 – 2015-11-17 (×4): 400 mg via ORAL
  Filled 2015-11-14 (×5): qty 2

## 2015-11-14 MED ORDER — OLANZAPINE 10 MG PO TABS: 10.0000 mg | ORAL_TABLET | Freq: Two times a day (BID) | ORAL | Status: DC | PRN

## 2015-11-14 NOTE — Plan of Care (Signed)
Problem: Coping: Goal: Ability to verbalize feelings will improve Outcome: Progressing Patient was able to express his feeling and concerns.
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
## 2015-11-14 NOTE — Progress Notes (Addendum)
DAR Note: Patient irritable this morning. Refused librium, states" that's all I have, I got out of bed for nothing". Did not attend any groups.  Encouragement and support offered. Pt receptive and remains safe on unit with q 15 min checks. WIll continue to assess and monitor for safety.

## 2015-11-14 NOTE — [facility] Suicide Risk Assessment (Signed)
[facility] INPATIENT:  Family/Significant Other Suicide Prevention Education  Suicide Prevention Education:  Patient Refusal for Family/Significant Other Suicide Prevention Education: The patient Reginald Bowman has refused to provide written consent for family/significant other to be provided Family/Significant Other Suicide Prevention Education during admission and/or prior to discharge.  Physician notified.  Daisy Floroandace L Tekoa Amon MSW, LCSWA  11/14/2015, 4:27 PM

## 2015-11-14 NOTE — Progress Notes (Signed)
[facility]  11/14/2015 12:25 PM Reginald Bowman  MRN:  [id number]  Subjective: Reginald Bowman has a history of depression, mood instability and self-injurious behavior, most likely borderline personality disorder, admitted for worsening of depression and self-injurious behavior with hitting his head and face. He dislikes any psychotropic medication but agreed to try Prozac for depression and Seroquel for mood stabilization. He is homeless and has no support. He completed Librium taper for benzodiazepine abuse. I have been increasing his Seroquel.  Today at the patient is asleep in bed. He reports that yesterday afternoon he had another episode of hitting his head. She was given 10 mg of Zyprexa with improvement. We will continue Zyprexa as needed. He reports again that he is very angry at himself and the world and wanting to kill himself and other people after he leaves the hospital. He is able to contract for safety here. He has not been able to participate in discharge planning as his options are few and he refuses to even consider homeless shelter. He threatens to kill himself. He accepts medications. He is mostly secluded to his room. There is no meaningful interaction with peers or staff.  Principal Problem: Bipolar I disorder, current episode depressed (HCC) Diagnosis:   Patient Active Problem List   Diagnosis Date Noted  . Post traumatic stress disorder (PTSD) [F43.10] 09/03/2014    Priority: Medium  . Cannabis use disorder, severe, dependence (HCC) [F12.20] 08/31/2014    Priority: Medium  . Involuntary commitment [Z04.6] 11/12/2015  . Sedative, hypnotic or anxiolytic use disorder, mild, abuse [F13.10] 11/11/2015  . Homicidal ideation [R45.850] 11/11/2015  . Bipolar I disorder, current episode depressed (HCC) [F31.30]   . Tobacco use disorder [F17.200] 09/20/2014  . Antisocial personality disorder [F60.2] 09/20/2014  . Borderline personality disorder [F60.3] 09/19/2014  . T8  vertebral fracture (HCC) [S22.069A] 08/13/2014  . T6 vertebral fracture (HCC) [S22.059A] 08/13/2014  . Alcohol addiction (HCC) [F10.20] 06/12/2013   Total Time spent with patient: 20 minutes  Past Psychiatric History: Bipolar disorder.  Past Medical History:  Past Medical History  Diagnosis Date  . Polysubstance abuse   . Depression   . Bipolar disorder (HCC)   . Chronic back pain    History reviewed. No pertinent past surgical history. Family History: History reviewed. No pertinent family history. Family Psychiatric  History: History of completed suicide by grandfather. Social History:  History  Alcohol Use No     History  Drug Use  . Yes  . Special: Marijuana    Social History   Social History  . Marital Status: Legally Separated    Spouse Name: N/A  . Number of Children: N/A  . Years of Education: N/A   Social History Main Topics  . Smoking status: Current Every Day Smoker    Types: Cigarettes  . Smokeless tobacco: None  . Alcohol Use: No  . Drug Use: Yes    Special: Marijuana  . Sexual Activity: Not Currently     Comment: cough pills   Other Topics Concern  . None   Social History Narrative   ** Merged History Encounter **       Additional Social History:      Name of Substance 1: Marijuana 1 - Age of First Use: [age]yrs 1 - Amount (size/oz): As much as he could get 1 - Frequency: 34/week                  Sleep: Fair  Appetite:  Fair  Current  Medications: Current Facility-Administered Medications  Medication Dose Route Frequency Provider Last Rate Last Dose  . acetaminophen (TYLENOL) tablet 650 mg  650 mg Oral Q6H PRN Audery Amel, MD      . alum & mag hydroxide-simeth (MAALOX/MYLANTA) 200-200-20 MG/5ML suspension 30 mL  30 mL Oral Q4H PRN Audery Amel, MD      . chlordiazePOXIDE (LIBRIUM) capsule 10 mg  10 mg Oral QID Shari Prows, MD   10 mg at 11/13/15 2110  . FLUoxetine (PROZAC) capsule 20 mg  20 mg Oral QHS Shari Prows, MD   20 mg at 11/13/15 2110  . magnesium hydroxide (MILK OF MAGNESIA) suspension 30 mL  30 mL Oral Daily PRN Audery Amel, MD      . QUEtiapine (SEROQUEL) tablet 200 mg  200 mg Oral QHS Noa Constante B Trystyn Dolley, MD   200 mg at 11/13/15 2110    Lab Results: No results found for this or any previous visit (from the past 48 hour(s)).  Blood Alcohol level:  Lab Results  Component Value Date   ETH <5 11/10/2015   ETH 7* 09/27/2014    Metabolic Disorder Labs: No results found for: HGBA1C, MPG No results found for: PROLACTIN Lab Results  Component Value Date   CHOL  11/22/2009    160        ATP III CLASSIFICATION:  <200     mg/dL   Desirable  161-096  mg/dL   Borderline High  >=045    mg/dL   High          TRIG 409 11/22/2009   HDL 33* 11/22/2009   CHOLHDL 4.8 11/22/2009   VLDL 20 11/22/2009   LDLCALC * 11/22/2009    107        Total Cholesterol/HDL:CHD Risk Coronary Heart Disease Risk Table                     Men   Women  1/2 Average Risk   3.4   3.3  Average Risk       5.0   4.4  2 X Average Risk   9.6   7.1  3 X Average Risk  23.4   11.0        Use the calculated Patient Ratio above and the CHD Risk Table to determine the patient's CHD Risk.        ATP III CLASSIFICATION (LDL):  <100     mg/dL   Optimal  811-914  mg/dL   Near or Above                    Optimal  130-159  mg/dL   Borderline  782-956  mg/dL   High  >213     mg/dL   Very High    Physical Findings: AIMS:  , ,  ,  , Dental Status Current problems with teeth and/or dentures?: No Does patient usually wear dentures?: No  CIWA:    COWS:     Musculoskeletal: Strength & Muscle Tone: within normal limits Gait & Station: normal Patient leans: N/A  Psychiatric Specialty Exam: Physical Exam  Nursing note and vitals reviewed.   Review of Systems  Psychiatric/Behavioral: Positive for depression and suicidal ideas.  All other systems reviewed and are negative.   Blood pressure 119/69,  pulse 81, temperature 97.6 F (36.4 C), temperature source Oral, resp. rate 19, height  (1.803 m), weight 81.647 kg (180 lb), SpO2 99 %.Body  mass index is 25.12 kg/(m^2).  General Appearance: Disheveled  Eye Contact:  Minimal  Speech:  Clear and Coherent  Volume:  Decreased  Mood:  Depressed, Hopeless and Worthless  Affect:  Labile  Thought Process:  Goal Directed  Orientation:  Full (Time, Place, and Person)  Thought Content:  WDL  Suicidal Thoughts:  Yes.  with intent/plan  Homicidal Thoughts:  Yes.  with intent/plan  Memory:  Immediate;   Fair Recent;   Fair Remote;   Fair  Judgement:  Impaired  Insight:  Lacking  Psychomotor Activity:  Increased  Concentration:  Concentration: Fair and Attention Span: Fair  Recall:  [employer] of Knowledge:  Fair  Language:  Fair  Akathisia:  No  Handed:  Right  AIMS (if indicated):     Assets:  Communication Skills Desire for Improvement Financial Resources/Insurance Physical Health Resilience  ADL's:  Intact  Cognition:  WNL  Sleep:  Number of Hours: 7.15     Treatment Plan Summary: Daily contact with patient to assess and evaluate symptoms and progress in treatment and Medication management   Mr. Arch is a 30 year old male with history of bipolar disorder, personality disorder, self-injurious behavior and substance abuse admitted for suicidal and homicidal threats in the context of substance treatment noncompliance.  1. Suicidal and homicidal ideation. The patient is able to contract for safety in the hospital.  2. Mood. He was started on Prozac for depression and Seroquel for mood stabilization. We will increase Seroquel to 400 mg . He refuses lithium, Tegretol, Depakote or Haldol.  3. Insomnia. Trazodone is available.  4. Smoking. Nicotine patch is available.  5 GERD. Protonix is available.  6. Benzodiazepine use. He completed Librium taper.    7. Substance abuse treatment. The patient minimizes problems and  declines treatment. He believes marijuana is his best medication.  8. Agitation. He was given Zyprexa 10 mg last night with improvement. Will make it available as needed.   9. Disposition. To be established. The patient is homeless and not welcome at our homeless shelter.  Kristine Linea, MD 11/14/2015, 12:25 PM

## 2015-11-14 NOTE — Progress Notes (Signed)
D: Pt denies SI/HI/AVH. Pt is angry and irritable at himself, striking himself in the face, and using using profanities, MD on call notified and orders was received for PRN agitation medication. Patient was later calm after administration of meds and cooperative with treatment plan.  Pt is interacting with peers and staff appropriately.  A: Pt was offered support and encouragement. Pt was given scheduled medications. Pt was encouraged to attend groups. Q 15 minute checks were done for safety.  R:Pt attends groups and interacts well with peers and staff. Pt is taking medication. Pt has no complaints.Pt receptive to treatment and safety maintained on unit.

## 2015-11-14 NOTE — [facility] Group Notes (Signed)
[facility] LCSW Group Therapy  11/14/2015 2:19 PM  Type of Therapy:  Group Therapy  Participation Level:  Did Not Attend but was invited to attend group.   Summary of Progress/Problems: Feelings around Relapse. Group members discussed the meaning of relapse and shared personal stories of relapse, how it affected them and others, and how they perceived themselves during this time. Group members were encouraged to identify triggers, warning signs and coping skills used when facing the possibility of relapse. Social supports were discussed and explored in detail.    Kamariah Fruchter G. Garnette CzechSampson MSW, LCSWA  11/14/2015, 2:19 PM

## 2015-11-14 NOTE — [facility] Group Notes (Signed)
[facility] Group Notes:  (Nursing/MHT/Case Management/Adjunct)  Date:  11/14/2015  Time:  2:54 AM  Type of Therapy:  Psychoeducational Skills  Participation Level:  Active  Participation Quality:  Appropriate  Affect:  Appropriate  Cognitive:  Appropriate  Insight:  Appropriate and Good  Engagement in Group:  Engaged  Modes of Intervention:  Discussion, Socialization and Support  Summary of Progress/Problems:  Chancy MilroyLaquanda Y Shandrika Ambers 11/14/2015, 2:54 AM

## 2015-11-15 DIAGNOSIS — F603 Borderline personality disorder: Secondary | ICD-10-CM

## 2015-11-15 NOTE — Progress Notes (Signed)
Pt has been pleasant and cooperative. Pt denies SI and A/V hallucinations. Pt has not attended any unit activities. Pt has been seclusive to his room. Pt's mood and affect has been depressed. Will continue to observe and maintain a safe environment.               

## 2015-11-15 NOTE — Progress Notes (Signed)
D: Pt is seen in the milieu this evening. He appears anxious and discusses with writer his tendency to pull his hair and hit himself in the head when feeling anxious. He states that he is unsure where he will be going after discharge and "I might just get in a car and go to the beach or something." Pt rates anxiety 2/10 at this time and depression 8/10. No episodes of self harm observed this evening. Pt reports passive SI and contracts for safety. Denies HI/AVH.  A: Emotional support and encouragement provided. Pt educated on positive coping skills. Medications administered with education. q15 minute safety checks maintained. R: Pt remains free from harm. Will continue to monitor.

## 2015-11-15 NOTE — Progress Notes (Signed)
[facility] MD Progress Note  11/15/2015 12:47 PM Reginald Bowman  MRN:  [id number]  Subjective: Reginald Bowman has a history of depression, mood instability and self-injurious behavior, most likely borderline personality disorder, admitted for worsening of depression and self-injurious behavior with hitting his head and face. Patient reported that he has been experiencing suicidal and homicidal ideations and he stated that he has lot of people on his list. He does not want to disclose that no more of those people. He reported that he has plans of cutting and stabbing. He reported that the medications are not helping him but he wants to get medical marijuana. He reported that it is very expensive to buy the marijuana here in [place] and he wants to relocate to some other state. Patient appeared depressed during the interview. He reported that the medications do not help him much. He currently contracted for safety and stated that he will seek help if he gets worsening of his symptoms.   Principal Problem: Bipolar I disorder, current episode depressed (HCC) Diagnosis:   Patient Active Problem List   Diagnosis Date Noted  . Involuntary commitment [Z04.6] 11/12/2015  . Sedative, hypnotic or anxiolytic use disorder, mild, abuse [F13.10] 11/11/2015  . Homicidal ideation [R45.850] 11/11/2015  . Bipolar I disorder, current episode depressed (HCC) [F31.30]   . Tobacco use disorder [F17.200] 09/20/2014  . Antisocial personality disorder [F60.2] 09/20/2014  . Borderline personality disorder [F60.3] 09/19/2014  . Post traumatic stress disorder (PTSD) [F43.10] 09/03/2014  . Cannabis use disorder, severe, dependence (HCC) [F12.20] 08/31/2014  . T8 vertebral fracture (HCC) [S22.069A] 08/13/2014  . T6 vertebral fracture (HCC) [S22.059A] 08/13/2014  . Alcohol addiction (HCC) [F10.20] 06/12/2013   Total Time spent with patient: 20 minutes  Past Psychiatric History: Bipolar disorder.  Past Medical History:  Past  Medical History  Diagnosis Date  . Polysubstance abuse   . Depression   . Bipolar disorder (HCC)   . Chronic back pain    History reviewed. No pertinent past surgical history. Family History: History reviewed. No pertinent family history. Family Psychiatric  History: History of completed suicide by grandfather. Social History:  History  Alcohol Use No     History  Drug Use  . Yes  . Special: Marijuana    Social History   Social History  . Marital Status: Legally Separated    Spouse Name: N/A  . Number of Children: N/A  . Years of Education: N/A   Social History Main Topics  . Smoking status: Current Every Day Smoker    Types: Cigarettes  . Smokeless tobacco: None  . Alcohol Use: No  . Drug Use: Yes    Special: Marijuana  . Sexual Activity: Not Currently     Comment: cough pills   Other Topics Concern  . None   Social History Narrative   ** Merged History Encounter **       Additional Social History:      Name of Substance 1: Marijuana 1 - Age of First Use: 31yrs 1 - Amount (size/oz): As much as he could get 1 - Frequency: 34/week                  Sleep: Fair  Appetite:  Fair  Current Medications: Current Facility-Administered Medications  Medication Dose Route Frequency Provider Last Rate Last Dose  . acetaminophen (TYLENOL) tablet 650 mg  650 mg Oral Q6H PRN Audery Amel, MD      . alum & mag hydroxide-simeth (MAALOX/MYLANTA) 200-200-20 MG/5ML  suspension 30 mL  30 mL Oral Q4H PRN Audery Amel, MD      . FLUoxetine (PROZAC) capsule 20 mg  20 mg Oral QHS Shari Prows, MD   20 mg at 11/14/15 2202  . magnesium hydroxide (MILK OF MAGNESIA) suspension 30 mL  30 mL Oral Daily PRN Audery Amel, MD      . OLANZapine (ZYPREXA) tablet 10 mg  10 mg Oral BID PRN Jolanta B Pucilowska, MD      . QUEtiapine (SEROQUEL) tablet 400 mg  400 mg Oral QHS Jolanta B Pucilowska, MD   400 mg at 11/14/15 2202    Lab Results: No results found for this or  any previous visit (from the past 48 hour(s)).  Blood Alcohol level:  Lab Results  Component Value Date   ETH <5 11/10/2015   ETH 7* 09/27/2014    Metabolic Disorder Labs: No results found for: HGBA1C, MPG No results found for: PROLACTIN Lab Results  Component Value Date   CHOL  11/22/2009    160        ATP III CLASSIFICATION:  <200     mg/dL   Desirable  324-401  mg/dL   Borderline High  >=027    mg/dL   High          TRIG 253 11/22/2009   HDL 33* 11/22/2009   CHOLHDL 4.8 11/22/2009   VLDL 20 11/22/2009   LDLCALC * 11/22/2009    107        Total Cholesterol/HDL:CHD Risk Coronary Heart Disease Risk Table                     Men   Women  1/2 Average Risk   3.4   3.3  Average Risk       5.0   4.4  2 X Average Risk   9.6   7.1  3 X Average Risk  23.4   11.0        Use the calculated Patient Ratio above and the CHD Risk Table to determine the patient's CHD Risk.        ATP III CLASSIFICATION (LDL):  <100     mg/dL   Optimal  664-403  mg/dL   Near or Above                    Optimal  130-159  mg/dL   Borderline  474-259  mg/dL   High  >563     mg/dL   Very High    Physical Findings: AIMS:  , ,  ,  , Dental Status Current problems with teeth and/or dentures?: No Does patient usually wear dentures?: No  CIWA:    COWS:     Musculoskeletal: Strength & Muscle Tone: within normal limits Gait & Station: normal Patient leans: N/A  Psychiatric Specialty Exam: Physical Exam  Nursing note and vitals reviewed.   Review of Systems  Psychiatric/Behavioral: Positive for depression and suicidal ideas. The patient is nervous/anxious and has insomnia.   All other systems reviewed and are negative.   Blood pressure 101/69, pulse 67, temperature 97.5 F (36.4 C), temperature source Oral, resp. rate 19, height 5\' 11"  (1.803 m), weight 180 lb (81.647 kg), SpO2 99 %.Body mass index is 25.12 kg/(m^2).  General Appearance: Disheveled  Eye Contact:  Minimal  Speech:  Clear  and Coherent  Volume:  Decreased  Mood:  Depressed, Hopeless and Worthless  Affect:  Labile  Thought Process:  Goal Directed  Orientation:  Full (Time, Place, and Person)  Thought Content:  WDL  Suicidal Thoughts:  Yes.  with intent/plan  Homicidal Thoughts:  Yes.  with intent/plan  Memory:  Immediate;   Fair Recent;   Fair Remote;   Fair  Judgement:  Impaired  Insight:  Lacking  Psychomotor Activity:  Increased  Concentration:  Concentration: Fair and Attention Span: Fair  Recall:  [employer] of Knowledge:  Fair  Language:  Fair  Akathisia:  No  Handed:  Right  AIMS (if indicated):     Assets:  Communication Skills Desire for Improvement Financial Resources/Insurance Physical Health Resilience  ADL's:  Intact  Cognition:  WNL  Sleep:  Number of Hours: 5.5     Treatment Plan Summary: Daily contact with patient to assess and evaluate symptoms and progress in treatment and Medication management   Reginald Bowman is a 30 year old male with history of bipolar disorder, personality disorder, self-injurious behavior and substance abuse admitted for suicidal and homicidal threats in the context of substance treatment noncompliance.  1. Suicidal and homicidal ideation. The patient is able to contract for safety in the hospital.  2. Mood. He was started on Prozac for depression and Seroquel for mood stabilization. We will increase Seroquel to 400 mg . He refuses lithium, Tegretol, Depakote or Haldol.  3. Insomnia. Trazodone is available.  4. Smoking. Nicotine patch is available.  5 GERD. Protonix is available.  6. Benzodiazepine use. He completed Librium taper.    7. Substance abuse treatment. The patient minimizes problems and declines treatment. He believes marijuana is his best medication.  8. Agitation. He was given Zyprexa 10 mg last night with improvement. Will make it available as needed.   9. Disposition. To be established. The patient is homeless and not welcome at  our homeless shelter.  Brandy Hale, MD 11/15/2015, 12:47 PM

## 2015-11-15 NOTE — [facility] Group Notes (Signed)
[facility] LCSW Group Therapy  11/15/2015 3:19 PM  Type of Therapy:  Group Therapy  Participation Level:  Did Not Attend   Modes of Intervention:  Discussion, Education, Socialization and Support  Summary of Progress/Problems: Feelings around Relapse. Group members discussed the meaning of relapse and shared personal stories of relapse, how it affected them and others, and how they perceived themselves during this time. Group members were encouraged to identify triggers, warning signs and coping skills used when facing the possibility of relapse. Social supports were discussed and explored in detail.   Ever Gustafson L Nickey Canedo MSW, LCSWA  11/15/2015, 3:19 PM  

## 2015-11-15 NOTE — Plan of Care (Signed)
Problem: Education: Goal: Emotional status will improve Outcome: Not Progressing Pt denies SI at this time, although he does state "it's always going to be there." Pt reports that his depression "goes up and down."  Problem: Safety: Goal: Ability to identify and utilize support systems that promote safety will improve Outcome: Progressing Pt discusses with writer the importance of having a support system to talk to when feeling suicidal. He identifies his aunt as his support system.

## 2015-11-15 NOTE — Plan of Care (Signed)
Problem: Safety: Goal: Periods of time without injury will increase Outcome: Progressing Pt remains free from harm.   Problem: Coping: Goal: Ability to verbalize feelings will improve Outcome: Progressing Pt discusses with writer anxiety over where he is going to go after discharge.

## 2015-11-16 NOTE — Progress Notes (Signed)
Pt has been pleasant and cooperative. Pt denies SI and A/V hallucinations. Pt has not attended any unit activities. Pt has been seclusive to his room. Pt's mood and affect has been depressed. Will continue to observe and maintain a safe environment.               

## 2015-11-16 NOTE — [facility] Group Notes (Signed)
[facility] LCSW Group Therapy  11/16/2015 3:35 PM  Type of Therapy:  Group Therapy  Participation Level:  Pt did not attend group. CSW invited pt to group.   Summary of Progress/Problems: Patients defined and discussed healthy coping skills. Patients identified healthy coping skills they would like to try during hospitalization and after discharge. CSW offered insight to varying coping skills that may have been new to patients such as practicing mindfulness.   Takera Rayl G. Garnette Czech MSW, LCSWA 11/16/2015, 3:35 PM

## 2015-11-16 NOTE — [facility] Group Notes (Signed)
[facility] Group Notes:  (Nursing/MHT/Case Management/Adjunct)  Date:  11/16/2015  Time:  11:15 PM  Type of Therapy:  Group Therapy  Participation Level:  Active  Participation Quality:  Appropriate  Affect:  Appropriate  Cognitive:  Appropriate  Insight:  Appropriate  Engagement in Group:  Engaged  Modes of Intervention:  n/a  Summary of Progress/Problems:  Reginald Bowman 11/16/2015, 11:15 PM

## 2015-11-16 NOTE — Progress Notes (Signed)
D: Pt affect is depressed this evening. He rates depression 2/10 at this time, although he states "it goes up and down. It's fine now but when I'm alone it gets worse." Pt continues to verbalize anxiety over where he will go after discharge. He reports "I [place]." Pt discusses with Clinical research associate sources of social support he has, such as his aunt and friends he communicates with on the Internet. He denies SI at this time, but states "its always gonna be there." Denies HI/AVH. A: Emotional support and encouragement provided. Medications administered with education. Pt educated on positive coping skills. q15 minute safety checks maintained. R: Pt remains free from harm. Will continue to monitor.

## 2015-11-16 NOTE — Progress Notes (Signed)
[facility] MD Progress Note  11/16/2015 3:14 PM Reginald Bowman  MRN:  [id number]  Subjective: Mr. Canner is 30 year old male with history of depression mood instability and self-injurious behavior, most likely borderline personality disorder, admitted for worsening of depression and self-injurious behavior with hitting his head and face. Patient reported that he has been having suicidal homicidal ideations and was always will have the same. He stated that that he stays on his mind most of the time. He heard about the death of the famous musician  by hanging himself yesterday and it made him more depressed. He reported that he felt like that there is nothing to enjoy in the life anymore. He reported that there is no exception for anybody. He reported that he does not have any active plans for himself. He contracted for safety. He does not really to monitor him closely as he thinks that it will make him feel worse. He spends most of the time in his room. He has been compliant with his medications.  He reported that the medications are not helping him but he wants to get medical marijuana.  Principal Problem: Bipolar I disorder, current episode depressed (HCC) Diagnosis:   Patient Active Problem List   Diagnosis Date Noted  . Involuntary commitment [Z04.6] 11/12/2015  . Sedative, hypnotic or anxiolytic use disorder, mild, abuse [F13.10] 11/11/2015  . Homicidal ideation [R45.850] 11/11/2015  . Bipolar I disorder, current episode depressed (HCC) [F31.30]   . Tobacco use disorder [F17.200] 09/20/2014  . Antisocial personality disorder [F60.2] 09/20/2014  . Borderline personality disorder [F60.3] 09/19/2014  . Post traumatic stress disorder (PTSD) [F43.10] 09/03/2014  . Cannabis use disorder, severe, dependence (HCC) [F12.20] 08/31/2014  . T8 vertebral fracture (HCC) [S22.069A] 08/13/2014  . T6 vertebral fracture (HCC) [S22.059A] 08/13/2014  . Alcohol addiction (HCC) [F10.20] 06/12/2013   Total Time spent  with patient: 20 minutes  Past Psychiatric History: Bipolar disorder.  Past Medical History:  Past Medical History:  Diagnosis Date  . Bipolar disorder (HCC)   . Chronic back pain   . Depression   . Polysubstance abuse    History reviewed. No pertinent surgical history. Family History: History reviewed. No pertinent family history. Family Psychiatric  History: History of completed suicide by grandfather. Social History:  History  Alcohol Use No     History  Drug Use  . Types: Marijuana    Social History   Social History  . Marital status: Legally Separated    Spouse name: N/A  . Number of children: N/A  . Years of education: N/A   Social History Main Topics  . Smoking status: Current Every Day Smoker    Types: Cigarettes  . Smokeless tobacco: None  . Alcohol use No  . Drug use:     Types: Marijuana  . Sexual activity: Not Currently     Comment: cough pills   Other Topics Concern  . None   Social History Narrative   ** Merged History Encounter **       Additional Social History:      Name of Substance 1: Marijuana 1 - Age of First Use: 73yrs 1 - Amount (size/oz): As much as he could get 1 - Frequency: 34/week                  Sleep: Fair  Appetite:  Fair  Current Medications: Current Facility-Administered Medications  Medication Dose Route Frequency Provider Last Rate Last Dose  . acetaminophen (TYLENOL) tablet 650 mg  650 mg Oral  Q6H PRN Audery Amel, MD      . alum & mag hydroxide-simeth (MAALOX/MYLANTA) 200-200-20 MG/5ML suspension 30 mL  30 mL Oral Q4H PRN Audery Amel, MD      . FLUoxetine (PROZAC) capsule 20 mg  20 mg Oral QHS Shari Prows, MD   20 mg at 11/15/15 2221  . magnesium hydroxide (MILK OF MAGNESIA) suspension 30 mL  30 mL Oral Daily PRN Audery Amel, MD      . OLANZapine (ZYPREXA) tablet 10 mg  10 mg Oral BID PRN Jolanta B Pucilowska, MD      . QUEtiapine (SEROQUEL) tablet 400 mg  400 mg Oral QHS Jolanta B  Pucilowska, MD   400 mg at 11/15/15 2221    Lab Results: No results found for this or any previous visit (from the past 48 hour(s)).  Blood Alcohol level:  Lab Results  Component Value Date   ETH <5 11/10/2015   ETH 7 (H) 09/27/2014    Metabolic Disorder Labs: No results found for: HGBA1C, MPG No results found for: PROLACTIN Lab Results  Component Value Date   CHOL  11/22/2009    160        ATP III CLASSIFICATION:  <200     mg/dL   Desirable  889-169  mg/dL   Borderline High  >=450    mg/dL   High          TRIG 388 11/22/2009   HDL 33 (L) 11/22/2009   CHOLHDL 4.8 11/22/2009   VLDL 20 11/22/2009   LDLCALC (H) 11/22/2009    107        Total Cholesterol/HDL:CHD Risk Coronary Heart Disease Risk Table                     Men   Women  1/2 Average Risk   3.4   3.3  Average Risk       5.0   4.4  2 X Average Risk   9.6   7.1  3 X Average Risk  23.4   11.0        Use the calculated Patient Ratio above and the CHD Risk Table to determine the patient's CHD Risk.        ATP III CLASSIFICATION (LDL):  <100     mg/dL   Optimal  828-003  mg/dL   Near or Above                    Optimal  130-159  mg/dL   Borderline  491-791  mg/dL   High  >505     mg/dL   Very High    Physical Findings: AIMS:  , ,  ,  , Dental Status Current problems with teeth and/or dentures?: No Does patient usually wear dentures?: No  CIWA:    COWS:     Musculoskeletal: Strength & Muscle Tone: within normal limits Gait & Station: normal Patient leans: N/A  Psychiatric Specialty Exam: Physical Exam  Nursing note and vitals reviewed.   Review of Systems  Psychiatric/Behavioral: Positive for depression and suicidal ideas. The patient is nervous/anxious and has insomnia.   All other systems reviewed and are negative.   Blood pressure 114/72, pulse 62, temperature 97.7 F (36.5 C), temperature source Oral, resp. rate 18, height 5\' 11"  (1.803 m), weight 180 lb (81.6 kg), SpO2 99 %.Body mass index  is 25.1 kg/m.  General Appearance: Disheveled  Eye Contact:  Minimal  Speech:  Clear and Coherent  Volume:  Decreased  Mood:  Depressed, Hopeless and Worthless  Affect:  Labile  Thought Process:  Goal Directed  Orientation:  Full (Time, Place, and Person)  Thought Content:  WDL  Suicidal Thoughts:  Yes.  with intent/plan  Homicidal Thoughts:  Yes.  with intent/plan  Memory:  Immediate;   Fair Recent;   Fair Remote;   Fair  Judgement:  Impaired  Insight:  Lacking  Psychomotor Activity:  Increased  Concentration:  Concentration: Fair and Attention Span: Fair  Recall:  [employer] of Knowledge:  Fair  Language:  Fair  Akathisia:  No  Handed:  Right  AIMS (if indicated):     Assets:  Communication Skills Desire for Improvement Financial Resources/Insurance Physical Health Resilience  ADL's:  Intact  Cognition:  WNL  Sleep:  Number of Hours: 5.5     Treatment Plan Summary: Daily contact with patient to assess and evaluate symptoms and progress in treatment and Medication management   Mr. Robinette is a 30 year old male with history of bipolar disorder, personality disorder, self-injurious behavior and substance abuse admitted for suicidal and homicidal threats in the context of substance treatment noncompliance.  1. Suicidal and homicidal ideation. The patient is able to contract for safety in the hospital.  2. Mood. He was started on Prozac for depression and Seroquel for mood stabilization. We will increase Seroquel to 400 mg . He refuses lithium, Tegretol, Depakote or Haldol.  3. Insomnia. Trazodone is available.  4. Smoking. Nicotine patch is available.  5 GERD. Protonix is available.  6. Benzodiazepine use. No active withdrawal symptoms noted at this time.  7. Substance abuse treatment. The patient minimizes problems and declines treatment. He believes marijuana is his best medication.  8. Agitation. He was given Zyprexa 10 mg last night with improvement. Will make  it available as needed.   9. Disposition. To be established. The patient is homeless and not welcome at our homeless shelter.  Brandy Hale, MD 11/16/2015, 3:14 PM

## 2015-11-17 NOTE — Plan of Care (Signed)
Problem: Activity: Goal: Interest or engagement in activities will improve Outcome: Progressing Pt is seen in the milieu interacting with peers and staff. He did attend evening group.

## 2015-11-17 NOTE — [facility] Group Notes (Signed)
[facility] Group Notes:  (Nursing/MHT/Case Management/Adjunct)  Date:  11/17/2015  Time:  11:18 PM  Type of Therapy:  Evening Wrap-up Group  Participation Level:  Active  Participation Quality:  Appropriate, Attentive and Supportive  Affect:  Appropriate  Cognitive:  Alert and Appropriate  Insight:  Appropriate and Good  Engagement in Group:  Engaged  Modes of Intervention:  Activity  Summary of Progress/Problems: Patient very active in outside activity. Patient expressed excitement about discharge and possibly moving into his own apartment.  Dunbar Buras Nanta Roland Prine 11/17/2015, 11:18 PM

## 2015-11-17 NOTE — Plan of Care (Signed)
Problem: Education: Goal: Emotional status will improve Outcome: Progressing Pt pleasant today, spent more time in mileu with peers

## 2015-11-17 NOTE — Progress Notes (Signed)
[facility] MD Progress Note  11/17/2015 9:15 AM Reginald Bowman  MRN:  [id number]  Subjective:  Reginald Bowman is still angry, depressed, and suicidal. He is able to contract for safety in the hospital. He stays in his room and does not interact with staff or peers. He sleeps better with Seroquel but does not believe that there is any value to taking medications. He is unable to participate in discharge planning and tells me that he will kill himself if we sent him the homeless shelter in [place]. He seems to be waiting for his check at the beginning of next month. The patient has been chronically suicidal for years and has had multiple suicide attempts.  Principal Problem: Bipolar I disorder, current episode depressed (HCC) Diagnosis:   Patient Active Problem List   Diagnosis Date Noted  . Post traumatic stress disorder (PTSD) [F43.10] 09/03/2014    Priority: Medium  . Cannabis use disorder, severe, dependence (HCC) [F12.20] 08/31/2014    Priority: Medium  . Involuntary commitment [Z04.6] 11/12/2015  . Sedative, hypnotic or anxiolytic use disorder, mild, abuse [F13.10] 11/11/2015  . Homicidal ideation [R45.850] 11/11/2015  . Bipolar I disorder, current episode depressed (HCC) [F31.30]   . Tobacco use disorder [F17.200] 09/20/2014  . Antisocial personality disorder [F60.2] 09/20/2014  . Borderline personality disorder [F60.3] 09/19/2014  . T8 vertebral fracture (HCC) [S22.069A] 08/13/2014  . T6 vertebral fracture (HCC) [S22.059A] 08/13/2014  . Alcohol addiction (HCC) [F10.20] 06/12/2013   Total Time spent with patient: 20 minutes  Past Psychiatric History: Bipolar disorder.  Past Medical History:  Past Medical History:  Diagnosis Date  . Bipolar disorder (HCC)   . Chronic back pain   . Depression   . Polysubstance abuse    History reviewed. No pertinent surgical history. Family History: History reviewed. No pertinent family history. Family Psychiatric  History: See H&P. Social History:   History  Alcohol Use No     History  Drug Use  . Types: Marijuana    Social History   Social History  . Marital status: Legally Separated    Spouse name: N/A  . Number of children: N/A  . Years of education: N/A   Social History Main Topics  . Smoking status: Current Every Day Smoker    Types: Cigarettes  . Smokeless tobacco: None  . Alcohol use No  . Drug use:     Types: Marijuana  . Sexual activity: Not Currently     Comment: cough pills   Other Topics Concern  . None   Social History Narrative   ** Merged History Encounter **       Additional Social History:      Name of Substance 1: Marijuana 1 - Age of First Use: 37yrs 1 - Amount (size/oz): As much as he could get 1 - Frequency: 34/week                  Sleep: Fair  Appetite:  Fair  Current Medications: Current Facility-Administered Medications  Medication Dose Route Frequency Provider Last Rate Last Dose  . acetaminophen (TYLENOL) tablet 650 mg  650 mg Oral Q6H PRN Audery Amel, MD      . alum & mag hydroxide-simeth (MAALOX/MYLANTA) 200-200-20 MG/5ML suspension 30 mL  30 mL Oral Q4H PRN Audery Amel, MD      . FLUoxetine (PROZAC) capsule 20 mg  20 mg Oral QHS Shari Prows, MD   20 mg at 11/16/15 2207  . magnesium hydroxide (MILK OF MAGNESIA) suspension  30 mL  30 mL Oral Daily PRN Audery Amel, MD      . OLANZapine (ZYPREXA) tablet 10 mg  10 mg Oral BID PRN Asiel Chrostowski B Baylin Cabal, MD      . QUEtiapine (SEROQUEL) tablet 400 mg  400 mg Oral QHS Melinda Pottinger B Deanette Tullius, MD   400 mg at 11/16/15 2207    Lab Results: No results found for this or any previous visit (from the past 48 hour(s)).  Blood Alcohol level:  Lab Results  Component Value Date   ETH <5 11/10/2015   ETH 7 (H) 09/27/2014    Metabolic Disorder Labs: No results found for: HGBA1C, MPG No results found for: PROLACTIN Lab Results  Component Value Date   CHOL  11/22/2009    160        ATP III CLASSIFICATION:  <200      mg/dL   Desirable  384-536  mg/dL   Borderline High  >=468    mg/dL   High          TRIG 032 11/22/2009   HDL 33 (L) 11/22/2009   CHOLHDL 4.8 11/22/2009   VLDL 20 11/22/2009   LDLCALC (H) 11/22/2009    107        Total Cholesterol/HDL:CHD Risk Coronary Heart Disease Risk Table                     Men   Women  1/2 Average Risk   3.4   3.3  Average Risk       5.0   4.4  2 X Average Risk   9.6   7.1  3 X Average Risk  23.4   11.0        Use the calculated Patient Ratio above and the CHD Risk Table to determine the patient's CHD Risk.        ATP III CLASSIFICATION (LDL):  <100     mg/dL   Optimal  122-482  mg/dL   Near or Above                    Optimal  130-159  mg/dL   Borderline  500-370  mg/dL   High  >488     mg/dL   Very High    Physical Findings: AIMS:  , ,  ,  , Dental Status Current problems with teeth and/or dentures?: No Does patient usually wear dentures?: No  CIWA:    COWS:     Musculoskeletal: Strength & Muscle Tone: within normal limits Gait & Station: normal Patient leans: N/A  Psychiatric Specialty Exam: Physical Exam  Nursing note and vitals reviewed.   Review of Systems  Psychiatric/Behavioral: Positive for depression and suicidal ideas. The patient is nervous/anxious.   All other systems reviewed and are negative.   Blood pressure 113/67, pulse 63, temperature 97.6 F (36.4 C), temperature source Oral, resp. rate 18, height 5\' 11"  (1.803 m), weight 81.6 kg (180 lb), SpO2 99 %.Body mass index is 25.1 kg/m.  General Appearance: Disheveled  Eye Contact:  None  Speech:  Clear and Coherent  Volume:  Normal  Mood:  Angry, Dysphoric and Irritable  Affect:  Congruent  Thought Process:  Goal Directed  Orientation:  Full (Time, Place, and Person)  Thought Content:  WDL  Suicidal Thoughts:  Yes.  with intent/plan  Homicidal Thoughts:  No  Memory:  Immediate;   Fair Recent;   Fair Remote;   Fair  Judgement:  Poor  Insight:  Lacking   Psychomotor Activity:  Decreased  Concentration:  Concentration: Fair and Attention Span: Fair  Recall:  [employer] of Knowledge:  Fair  Language:  Fair  Akathisia:  No  Handed:  Right  AIMS (if indicated):     Assets:  Communication Skills Desire for Improvement Financial Resources/Insurance Physical Health Resilience  ADL's:  Intact  Cognition:  WNL  Sleep:  Number of Hours: 6.75     Treatment Plan Summary: Daily contact with patient to assess and evaluate symptoms and progress in treatment and Medication management   Mr. Millirons is a 30 year old male with history of bipolar disorder, personality disorder, self-injurious behavior and substance abuse admitted for suicidal and homicidal threats in the context of substance use and treatment noncompliance.  1. Suicidal and homicidal ideation. The patient is able to contract for safety in the hospital.  2. Mood. He was started on Prozac for depression and Seroquel for mood stabilization. He refuses lithium, Tegretol, Depakote or Haldol.  3. Insomnia. Trazodone is available.  4. Smoking. Nicotine patch is available.  5 GERD. Protonix is available.  6. Benzodiazepine use. No active withdrawal symptoms noted at this time.  7. Substance abuse treatment. The patient minimizes problems and declines treatment. He believes marijuana is his best medication.  8. Agitation. He responds well to prn dose of Zyprexa. There was one episode of hitting his face last weekend.   9. Disposition. To be established. The patient is homeless and not welcome at our homeless shelter.  Kristine Linea, MD 11/17/2015, 9:15 AM

## 2015-11-17 NOTE — Progress Notes (Signed)
D: Patient more active in milieu, socializing with peers. Continues with sad, flat affect. Offers minimal conversation. Denies SI, HI, AVH.  A: Emotional support provided. q 15 min safety checks continues. R: Pt not receptive, and remains safe on unit with q 15 min checks.

## 2015-11-17 NOTE — [facility] Group Notes (Signed)
[facility] LCSW Group Therapy   11/17/2015 9:30 am Type of Therapy: Group Therapy   Participation Level: Invited but did not attend.  Participation Quality: Invited but did not attend.  Jamarria Real, LCSWA     

## 2015-11-17 NOTE — Progress Notes (Signed)
D: Pt is active in the milieu this evening. Affect is depressed. Pt rates depression 7/10 and anxiety 2/10 at this time. Denies SI/HI/AVH. Pt discusses with Clinical research associate his aftercare plans, stating "I know what works for me. The stuff they're suggesting for me to do wouldn't work." Pt is focused on the death of a famous Technical sales engineer. A: Emotional support and encouragement provided. Medications administered with education. q15 minute safety checks maintained. R: Pt remains free from harm. Will continue to monitor.

## 2015-11-18 MED ORDER — FLUOXETINE HCL 20 MG PO CAPS: 20.0000 mg | ORAL_CAPSULE | Freq: Every day | ORAL | 3 refills | Status: DC

## 2015-11-18 MED ORDER — QUETIAPINE FUMARATE 400 MG PO TABS: 400.0000 mg | ORAL_TABLET | Freq: Every day | ORAL | 3 refills | Status: AC

## 2015-11-18 MED ORDER — FLUOXETINE HCL 20 MG PO CAPS: 20.0000 mg | ORAL_CAPSULE | Freq: Every day | ORAL | 3 refills | Status: AC

## 2015-11-18 MED ORDER — QUETIAPINE FUMARATE 400 MG PO TABS: 400.0000 mg | ORAL_TABLET | Freq: Every day | ORAL | 3 refills | Status: DC

## 2015-11-18 NOTE — Plan of Care (Signed)
Problem: Safety: Goal: Ability to remain free from injury will improve Outcome: Progressing Patient has remained free of injury during this shift.    
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
## 2015-11-18 NOTE — Progress Notes (Signed)
D:Patient aware of discharge this shift . Patient returning home or to tent  . Patient received all belonging locked up . Patient denies  Suicidal  And homicidal ideations  .  A: Writer instructed on discharge criteria  . Informed of 7 day supply  Of medication AVS Form , Suicide Risk Assessment  Discharge Summary  prescriptions  given  . Aware  Of follow up appointment . R: Patient left unit with no questions  Or concerns  on Bus

## 2015-11-18 NOTE — [facility] Suicide Risk Assessment (Signed)
[facility] Discharge Suicide Risk Assessment   Principal Problem: Bipolar I disorder, current episode depressed [facility]) Discharge Diagnoses:  Patient Active Problem List   Diagnosis Date Noted  . Post traumatic stress disorder (PTSD) [F43.10] 09/03/2014    Priority: Medium  . Cannabis use disorder, severe, dependence (HCC) [F12.20] 08/31/2014    Priority: Medium  . Involuntary commitment [Z04.6] 11/12/2015  . Sedative, hypnotic or anxiolytic use disorder, mild, abuse [F13.10] 11/11/2015  . Homicidal ideation [R45.850] 11/11/2015  . Bipolar I disorder, current episode depressed (HCC) [F31.30]   . Tobacco use disorder [F17.200] 09/20/2014  . Antisocial personality disorder [F60.2] 09/20/2014  . Borderline personality disorder [F60.3] 09/19/2014  . T8 vertebral fracture (HCC) [S22.069A] 08/13/2014  . T6 vertebral fracture (HCC) [S22.059A] 08/13/2014  . Alcohol addiction (HCC) [F10.20] 06/12/2013    Total Time spent with patient: 30 minutes  Musculoskeletal: Strength & Muscle Tone: within normal limits Gait & Station: normal Patient leans: N/A  Psychiatric Specialty Exam: Review of Systems  Psychiatric/Behavioral: Positive for substance abuse.  All other systems reviewed and are negative.   Blood pressure 131/75, pulse 61, temperature 97.9 F (36.6 C), temperature source Oral, resp. rate 18, height 5\' 11"  (1.803 m), weight 81.6 kg (180 lb), SpO2 99 %.Body mass index is 25.1 kg/m.  General Appearance: Casual  Eye Contact::  Good  Speech:  Clear and Coherent409  Volume:  Normal  Mood:  Euthymic  Affect:  Appropriate  Thought Process:  Goal Directed  Orientation:  Full (Time, Place, and Person)  Thought Content:  WDL  Suicidal Thoughts:  No  Homicidal Thoughts:  No  Memory:  Immediate;   Fair Recent;   Fair Remote;   Fair  Judgement:  Impaired  Insight:  Shallow  Psychomotor Activity:  Normal  Concentration:  Fair  Recall:  [employer] of Knowledge:Fair  Language: Fair   Akathisia:  No  Handed:  Right  AIMS (if indicated):     Assets:  Communication Skills Desire for Improvement Financial Resources/Insurance Physical Health Resilience  Sleep:  Number of Hours: 6  Cognition: WNL  ADL's:  Intact   Mental Status Per Nursing Assessment::   On Admission:  Suicidal ideation indicated by patient  Demographic Factors:  Male, Caucasian, Low socioeconomic status and Living alone  Loss Factors: NA  Historical Factors: Prior suicide attempts, Family history of mental illness or substance abuse and Impulsivity  Risk Reduction Factors:   Sense of responsibility to family, Positive therapeutic relationship and Positive coping skills or problem solving skills  Continued Clinical Symptoms:  Bipolar Disorder:   Depressive phase Alcohol/Substance Abuse/Dependencies  Cognitive Features That Contribute To Risk:  None    Suicide Risk:  Minimal: No identifiable suicidal ideation.  Patients presenting with no risk factors but with morbid ruminations; may be classified as minimal risk based on the severity of the depressive symptoms    Plan Of Care/Follow-up recommendations:  Activity:  As tolerated. Diet:  Low sodium heart healthy. Other:  Keep follow-up appointments.  Kristine Linea, MD 11/18/2015, 11:35 AM

## 2015-11-18 NOTE — Progress Notes (Signed)
  [facility] Adult Case Management Discharge Plan :  Will you be returning to the same living situation after discharge:  Yes,   Pt remains homeless, he believes he can go back to [facility] At discharge, do you have transportation home?: Yes,   [facility] provided [place] bus transportation Do you have the ability to pay for your medications: Yes,   Medicaid  Release of information consent forms completed and in the chart;  Patient's signature needed at discharge.  Patient to Follow up at: Follow-up Information    [facility]. Go today.   Why:  Walk in for Hospitonday-Friday between 8am-3pm. Contact information: [address] [place] [address] [phone] FAX          Next level of care provider has access to [facility] Link:no  Safety Planning and Suicide Prevention discussed: Yes,     Have you used any form of tobacco in the last 30 days? (Cigarettes, Smokeless Tobacco, Cigars, and/or Pipes): Yes  Has patient been referred to the Quitline?: Patient refused referral  Patient has been referred for addiction treatment: Yes  Graycen Sadlon, Cleda Daub, MSW, LCSW 11/18/2015, 5:03 PM

## 2015-11-18 NOTE — Tx Team (Signed)
Interdisciplinary Treatment Plan Update (Adult)  Date:  11/18/2015 Time Reviewed:  2:09 PM   Review of initial/current patient goals per problem list:   1. Goal(s): Patient will participate in aftercare plan  Met:YES  Target date: at discharge  As evidenced by: Patient will participate within aftercare plan AEB aftercare provider and housing plan at discharge being identified.  2. Goal (s): Patient will exhibit decreased depressive symptoms and suicidal ideations.  Met:YES  Target date: at discharge  As evidenced by: Patient will utilize self rating of depression at 3 or below and demonstrate decreased signs of depression or be deemed stable for discharge by MD.  3. Goal(s): Patient will demonstrate decreased signs and symptoms of anxiety.  Met:YES  Target date: at discharge  As evidenced by: Patient will utilize self rating of anxiety at 3 or below and demonstrated decreased signs of anxiety, or be deemed stable for discharge by MD Attendees: Patient:  Reginald Bowman 7/25/20172:09 PM  Family:   7/25/20172:09 PM  Physician:  Orson Slick 7/25/20172:09 PM  Nursing:   Polly Cobia, RN 7/25/20172:09 PM  Case Manager:   7/25/20172:09 PM  Counselor:  Dossie Arbour, [place] 7/25/20172:09 PM  Other:   7/25/20172:09 PM  Other:   7/25/20172:09 PM  Other:   7/25/20172:09 PM  Other:  7/25/20172:09 PM  Other:  7/25/20172:09 PM  Other:  7/25/20172:09 PM  Other:  7/25/20172:09 PM  Other:  7/25/20172:09 PM  Other:  7/25/20172:09 PM  Other:   7/25/20172:09 PM   Scribe for Treatment Team:   August Saucer, 11/18/2015, 2:09 PM, MSW, LCSW
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
